# Patient Record
Sex: Female | Born: 2000 | Race: White | Hispanic: No | Smoking: Never smoker
Health system: Southern US, Community
[De-identification: ages and names within clinical notes are randomized; demographics above are authoritative.]

## PROBLEM LIST (undated history)

## (undated) DIAGNOSIS — Z789 Other specified health status: Secondary | ICD-10-CM

## (undated) HISTORY — PX: NO PAST SURGERIES: SHX2092

## (undated) HISTORY — DX: Other specified health status: Z78.9

---
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
## 2022-03-17 ENCOUNTER — Telehealth: Payer: Self-pay | Admitting: Emergency Medicine

## 2022-03-17 NOTE — Telephone Encounter (Signed)
Patient states that she has been having painful periods. She reports that the bleeding is not that heavy but the pain associated has been more intense. Advised patient to make an appt. AP  ?

## 2022-03-21 ENCOUNTER — Ambulatory Visit: Payer: Medicaid Other | Admitting: Family Medicine

## 2022-03-24 ENCOUNTER — Other Ambulatory Visit (HOSPITAL_COMMUNITY)
Admission: RE | Admit: 2022-03-24 | Discharge: 2022-03-24 | Disposition: A | Discharge status: Home / Self Care | Payer: Medicaid Other | Source: Ambulatory Visit | Attending: Family Medicine | Admitting: Family Medicine

## 2022-03-24 ENCOUNTER — Ambulatory Visit (INDEPENDENT_AMBULATORY_CARE_PROVIDER_SITE_OTHER): Payer: Medicaid Other | Admitting: Family Medicine

## 2022-03-24 ENCOUNTER — Encounter: Payer: Self-pay | Admitting: Family Medicine

## 2022-03-24 VITALS — BP 98/65 | HR 58 | Temp 98.1°F | Ht 60.0 in | Wt 100.0 lb

## 2022-03-24 DIAGNOSIS — B379 Candidiasis, unspecified: Secondary | ICD-10-CM

## 2022-03-24 DIAGNOSIS — N946 Dysmenorrhea, unspecified: Secondary | ICD-10-CM | POA: Diagnosis not present

## 2022-03-24 LAB — URINALYSIS
Bilirubin, UA: NEGATIVE
Glucose, UA: NEGATIVE
Ketones, UA: NEGATIVE
Nitrite, UA: NEGATIVE
Protein,UA: NEGATIVE
Specific Gravity, UA: 1.02 (ref 1.005–1.030)
Urobilinogen, Ur: 0.2 mg/dL (ref 0.2–1.0)
pH, UA: 6.5 (ref 5.0–7.5)

## 2022-03-24 LAB — PREGNANCY, URINE: Preg Test, Ur: NEGATIVE

## 2022-03-24 MED ORDER — NORGESTIM-ETH ESTRAD TRIPHASIC 0.18/0.215/0.25 MG-25 MCG PO TABS: 1.0000 | ORAL_TABLET | Freq: Every day | ORAL | 11 refills | Status: DC

## 2022-03-24 MED ORDER — NAPROXEN 500 MG PO TABS: 500.0000 mg | ORAL_TABLET | Freq: Two times a day (BID) | ORAL | 0 refills | Status: DC

## 2022-03-24 NOTE — Progress Notes (Signed)
?  ? ?Subjective:  ?Patient ID: Jennifer Alexander, female    DOB: June 30, 2001, 21 y.o.   MRN: [id number] ? ?Patient Care Team: ?Claretta Fraise, MD as PCP - General (Family Medicine)  ? ?Chief Complaint:  Dysmenorrhea ? ? ?HPI: ?Jennifer Alexander is a 21 y.o. female presenting on 03/24/2022 for Dysmenorrhea ? ? ?Pt presents today with complaints of painful menstrual cramps. She was on OCPs when she was 16 and denies painful menses at this time. States she stopped the OCPs several years ago due to acne and weight gain. She is sexually active but denies any vaginal discharge or abnormal bleeding. No dyspareunia. Last unprotected intercourse over 4 weeks ago. LMP now. She does report regular cycles, usually heavy for first 3 days then light for the last 2. Reports significant cramping, worse at onset and eases off by end of cycle. She has been taking Tylenol and Midol with minimal relief of symptoms.  ?  ? ? ?Relevant past medical, surgical, family, and social history reviewed and updated as indicated.  ?Allergies and medications reviewed and updated. Data reviewed: Chart in Epic. ? ? ?History reviewed. No pertinent past medical history. ? ?History reviewed. No pertinent surgical history. ? ?Social History  ? ?Socioeconomic History  ? Marital status: Single  ?  Spouse name: Not on file  ? Number of children: Not on file  ? Years of education: Not on file  ? Highest education level: Not on file  ?Occupational History  ? Not on file  ?Tobacco Use  ? Smoking status: Never  ?  Passive exposure: Yes  ? Smokeless tobacco: Never  ?Substance and Sexual Activity  ? Alcohol use: No  ?  Alcohol/week: 0.0 standard drinks  ? Drug use: No  ? Sexual activity: Not on file  ?Other Topics Concern  ? Not on file  ?Social History Narrative  ? Not on file  ? ?Social Determinants of Health  ? ?Financial Resource Strain: Not on file  ?Food Insecurity: Not on file  ?Transportation Needs: Not on file  ?Physical Activity: Not on file  ?Stress: Not on  file  ?Social Connections: Not on file  ?Intimate Partner Violence: Not on file  ? ? ?Outpatient Encounter Medications as of 03/24/2022  ?Medication Sig  ? naproxen (NAPROSYN) 500 MG tablet Take 1 tablet (500 mg total) by mouth 2 (two) times daily with a meal. Start 2 days before cycle and continue until end of cycle  ? Norgestimate-Ethinyl Estradiol Triphasic (ORTHO TRI-CYCLEN LO) 0.18/0.215/0.25 MG-25 MCG tab Take 1 tablet by mouth daily.  ? [DISCONTINUED] amoxicillin-clavulanate (AUGMENTIN) 875-125 MG tablet Take 1 tablet by mouth 2 (two) times daily. Take all of this medication  ? [DISCONTINUED] SPRINTEC 28 0.25-35 MG-MCG tablet TAKE 1 TABLET BY MOUTH EVERY DAY  ? ?No facility-administered encounter medications on file as of 03/24/2022.  ? ? ?No Known Allergies ? ?Review of Systems  ?Constitutional:  Negative for activity change, appetite change, chills, fatigue and fever.  ?HENT: Negative.    ?Eyes: Negative.   ?Respiratory:  Negative for cough, chest tightness and shortness of breath.   ?Cardiovascular:  Negative for chest pain, palpitations and leg swelling.  ?Gastrointestinal:  Positive for abdominal pain. Negative for abdominal distention, anal bleeding, blood in stool, constipation, diarrhea, nausea, rectal pain and vomiting.  ?Endocrine: Negative.   ?Genitourinary:  Positive for menstrual problem. Negative for decreased urine volume, difficulty urinating, dyspareunia, dysuria, enuresis, flank pain, frequency, genital sores, hematuria, pelvic pain, urgency, vaginal bleeding, vaginal discharge and  vaginal pain.  ?Musculoskeletal:  Negative for arthralgias and myalgias.  ?Skin: Negative.   ?Allergic/Immunologic: Negative.   ?Neurological:  Negative for dizziness, weakness and headaches.  ?Hematological: Negative.   ?Psychiatric/Behavioral:  Negative for confusion, hallucinations, sleep disturbance and suicidal ideas.   ?All other systems reviewed and are negative. ? ?   ? ?Objective:  ?BP 98/65   Pulse (!) 58    Temp 98.1 ?F (36.7 ?C)   Ht 5' (1.524 m)   Wt 100 lb (45.4 kg)   LMP 03/15/2022 (Approximate)   SpO2 99%   BMI 19.53 kg/m?   ? ?Wt Readings from Last 3 Encounters:  ?03/24/22 100 lb (45.4 kg) (4 %, Z= -1.71)*  ?07/17/19 118 lb 6.4 oz (53.7 kg) (50 %, Z= 0.01)*  ?01/15/19 117 lb (53.1 kg) (52 %, Z= 0.04)*  ? ?* Growth percentiles are based on CDC (Girls, 2-20 Years) data.  ? ? ?Physical Exam ?Vitals and nursing note reviewed.  ?Constitutional:   ?   General: She is not in acute distress. ?   Appearance: Normal appearance. She is well-developed, well-groomed and normal weight. She is not ill-appearing, toxic-appearing or diaphoretic.  ?HENT:  ?   Head: Normocephalic and atraumatic.  ?   Jaw: There is normal jaw occlusion.  ?   Right Ear: Hearing normal.  ?   Left Ear: Hearing normal.  ?   Nose: Nose normal.  ?   Mouth/Throat:  ?   Lips: Pink.  ?   Mouth: Mucous membranes are moist.  ?   Pharynx: Oropharynx is clear. Uvula midline.  ?Eyes:  ?   General: Lids are normal.  ?   Extraocular Movements: Extraocular movements intact.  ?   Conjunctiva/sclera: Conjunctivae normal.  ?   Pupils: Pupils are equal, round, and reactive to light.  ?Neck:  ?   Thyroid: No thyroid mass, thyromegaly or thyroid tenderness.  ?   Vascular: No carotid bruit or JVD.  ?   Trachea: Trachea and phonation normal.  ?Cardiovascular:  ?   Rate and Rhythm: Normal rate and regular rhythm.  ?   Chest Wall: PMI is not displaced.  ?   Heart sounds: Normal heart sounds.  ?Pulmonary:  ?   Effort: Pulmonary effort is normal.  ?   Breath sounds: Normal breath sounds.  ?Abdominal:  ?   General: Bowel sounds are normal. There is no distension or abdominal bruit.  ?   Palpations: Abdomen is soft. There is no hepatomegaly, splenomegaly or mass.  ?   Tenderness: There is no abdominal tenderness. There is no right CVA tenderness, left CVA tenderness, guarding or rebound.  ?   Hernia: No hernia is present.  ?Musculoskeletal:  ?   Cervical back: Normal  range of motion and neck supple.  ?   Right lower leg: No edema.  ?   Left lower leg: No edema.  ?Lymphadenopathy:  ?   Cervical: No cervical adenopathy.  ?Skin: ?   General: Skin is warm and dry.  ?   Capillary Refill: Capillary refill takes less than 2 seconds.  ?   Coloration: Skin is not cyanotic.  ?Neurological:  ?   General: No focal deficit present.  ?   Mental Status: She is alert and oriented to person, place, and time.  ?   Sensory: Sensation is intact.  ?   Motor: Motor function is intact.  ?   Coordination: Coordination is intact.  ?   Gait: Gait is intact.  ?  Deep Tendon Reflexes: Reflexes are normal and symmetric.  ?Psychiatric:     ?   Attention and Perception: Attention and perception normal.     ?   Mood and Affect: Mood and affect normal.     ?   Speech: Speech normal.     ?   Behavior: Behavior normal. Behavior is cooperative.     ?   Thought Content: Thought content normal.     ?   Cognition and Memory: Cognition and memory normal.     ?   Judgment: Judgment normal.  ? ? ?Results for orders placed or performed in visit on 01/15/19  ?Rapid Strep Screen ([facility] ONLY)  ? Specimen: Other  ? OTHER  ?Result Value Ref Range  ? Strep Gp A Ag, IA W/Reflex Negative Negative  ?Culture, Group A Strep  ? OTHER  ?Result Value Ref Range  ? Strep A Culture CANCELED   ? ?   ? ?Pertinent labs & imaging results that were available during my care of the patient were reviewed by me and considered in my medical decision making. ? ?Assessment & Plan:  ?Neelie was seen today for dysmenorrhea. ? ?Diagnoses and all orders for this visit: ? ?Dysmenorrhea ?Pregnancy test negative. Urine dip unremarkable. STI testing pending. Will start OCPs as prescribed and Naproxen twice daily during cycles. If this is not beneficial, will obtain labs and [place]. Pt to follow up in 3 months.  ?-     Pregnancy, urine ?-     Urinalysis ?-     Urine cytology ancillary only ?-     Norgestimate-Ethinyl Estradiol Triphasic (ORTHO  TRI-CYCLEN LO) 0.18/0.215/0.25 MG-25 MCG tab; Take 1 tablet by mouth daily. ?-     naproxen (NAPROSYN) 500 MG tablet; Take 1 tablet (500 mg total) by mouth 2 (two) times daily with a meal. Start 2 days before cycle and

## 2022-03-27 LAB — URINE CYTOLOGY ANCILLARY ONLY
Candida Urine: POSITIVE — AB
Chlamydia: NEGATIVE
Comment: NEGATIVE
Comment: NEGATIVE
Comment: NORMAL
Neisseria Gonorrhea: NEGATIVE
Trichomonas: NEGATIVE

## 2022-03-28 MED ORDER — FLUCONAZOLE 150 MG PO TABS: ORAL_TABLET | ORAL | 0 refills | Status: DC

## 2022-03-28 NOTE — Addendum Note (Signed)
Addended by: Baruch Gouty on: 03/28/2022 11:41 AM ? ? Modules accepted: Orders ? ?

## 2022-06-01 DIAGNOSIS — N10 Acute tubulo-interstitial nephritis: Secondary | ICD-10-CM | POA: Diagnosis not present

## 2022-06-01 DIAGNOSIS — R112 Nausea with vomiting, unspecified: Secondary | ICD-10-CM | POA: Diagnosis not present

## 2022-08-01 DIAGNOSIS — N760 Acute vaginitis: Secondary | ICD-10-CM | POA: Diagnosis not present

## 2022-08-01 DIAGNOSIS — O23591 Infection of other part of genital tract in pregnancy, first trimester: Secondary | ICD-10-CM | POA: Diagnosis not present

## 2022-08-01 DIAGNOSIS — Z3A01 Less than 8 weeks gestation of pregnancy: Secondary | ICD-10-CM | POA: Diagnosis not present

## 2022-08-01 DIAGNOSIS — O209 Hemorrhage in early pregnancy, unspecified: Secondary | ICD-10-CM | POA: Diagnosis not present

## 2022-08-01 DIAGNOSIS — B9689 Other specified bacterial agents as the cause of diseases classified elsewhere: Secondary | ICD-10-CM | POA: Diagnosis not present

## 2022-08-01 LAB — OB RESULTS CONSOLE ABO/RH: RH Type: POSITIVE

## 2022-08-01 LAB — OB RESULTS CONSOLE GC/CHLAMYDIA
Chlamydia: NEGATIVE
Neisseria Gonorrhea: NEGATIVE

## 2022-08-01 LAB — OB RESULTS CONSOLE RUBELLA ANTIBODY, IGM: Rubella: IMMUNE

## 2022-08-01 LAB — OB RESULTS CONSOLE HIV ANTIBODY (ROUTINE TESTING): HIV: NONREACTIVE

## 2022-08-01 LAB — OB RESULTS CONSOLE HEPATITIS B SURFACE ANTIGEN: Hepatitis B Surface Ag: NEGATIVE

## 2022-08-14 DIAGNOSIS — Z3201 Encounter for pregnancy test, result positive: Secondary | ICD-10-CM | POA: Diagnosis not present

## 2022-08-14 DIAGNOSIS — N912 Amenorrhea, unspecified: Secondary | ICD-10-CM | POA: Diagnosis not present

## 2022-08-14 DIAGNOSIS — O219 Vomiting of pregnancy, unspecified: Secondary | ICD-10-CM | POA: Diagnosis not present

## 2022-08-14 DIAGNOSIS — Z3689 Encounter for other specified antenatal screening: Secondary | ICD-10-CM | POA: Diagnosis not present

## 2022-08-14 LAB — SICKLE CELL SCREEN: Sickle Cell Screen: NEGATIVE

## 2022-08-14 LAB — HEPATITIS C ANTIBODY: HCV Ab: NEGATIVE

## 2022-08-14 LAB — OB RESULTS CONSOLE ANTIBODY SCREEN: Antibody Screen: NEGATIVE

## 2022-08-14 LAB — OB RESULTS CONSOLE RPR: RPR: NONREACTIVE

## 2022-09-11 DIAGNOSIS — Z23 Encounter for immunization: Secondary | ICD-10-CM | POA: Diagnosis not present

## 2022-09-11 DIAGNOSIS — Z31430 Encounter of female for testing for genetic disease carrier status for procreative management: Secondary | ICD-10-CM | POA: Diagnosis not present

## 2022-09-11 DIAGNOSIS — Z3490 Encounter for supervision of normal pregnancy, unspecified, unspecified trimester: Secondary | ICD-10-CM | POA: Diagnosis not present

## 2022-09-11 DIAGNOSIS — Z3689 Encounter for other specified antenatal screening: Secondary | ICD-10-CM | POA: Diagnosis not present

## 2022-10-09 DIAGNOSIS — R42 Dizziness and giddiness: Secondary | ICD-10-CM | POA: Diagnosis not present

## 2022-10-09 DIAGNOSIS — O99280 Endocrine, nutritional and metabolic diseases complicating pregnancy, unspecified trimester: Secondary | ICD-10-CM | POA: Diagnosis not present

## 2022-10-09 DIAGNOSIS — Z3689 Encounter for other specified antenatal screening: Secondary | ICD-10-CM | POA: Diagnosis not present

## 2022-10-09 DIAGNOSIS — O99019 Anemia complicating pregnancy, unspecified trimester: Secondary | ICD-10-CM | POA: Diagnosis not present

## 2022-10-30 DIAGNOSIS — Z363 Encounter for antenatal screening for malformations: Secondary | ICD-10-CM | POA: Diagnosis not present

## 2022-11-28 DIAGNOSIS — Z3689 Encounter for other specified antenatal screening: Secondary | ICD-10-CM | POA: Diagnosis not present

## 2022-11-28 DIAGNOSIS — Z3401 Encounter for supervision of normal first pregnancy, first trimester: Secondary | ICD-10-CM | POA: Diagnosis not present

## 2022-12-04 NOTE — L&D Delivery Note (Signed)
[facility] Delivery Note  Zuri Saccomanno is a 22 y.o. G1P0 s/p NSVD at [redacted]w[redacted]d. She was admitted for IOL due to NRFHRT.   ROM: 8h 76m with meconium, thick fluid GBS Status: positive Maximum Maternal Temperature: 98.3  Labor Progress: Admitted for IOL due to NRFHRT in MAU but immediately improved on L&D Had FHR associated with lowered BP from epidural but was resuscitated  Delivery Date/Time: 03/17/23 0758 Delivery: Called to room and patient was complete and pushing. Head delivered OA. No nuchal cord present. There was compound posterior shoulder which was the right shoulder I did not perform any head traction and did a posteriro arm delivery. Shoulder and body delivered in usual fashion.  Infant with spontaneous cry, placed on mother's abdomen, handed to NICU team who I requested to be present due to the thick meconium stained fluid. Cord clamped x 2 and cut within 30 sec Cord blood drawn.  Placenta delivered spontaneously with gentle cord traction.  Fundus firm with massage and Pitocin.  Labia, perineum, vagina, and cervix inspected inspected and there   Placenta: intact 0806 Complications: none Lacerations: none EBL: 102    Infant: female  APGARs 8/9  pending  [facility]  Lazaro Arms, MD 03/17/2023 9:00 AM

## 2022-12-24 DIAGNOSIS — Z3A28 28 weeks gestation of pregnancy: Secondary | ICD-10-CM | POA: Diagnosis not present

## 2022-12-24 DIAGNOSIS — N930 Postcoital and contact bleeding: Secondary | ICD-10-CM | POA: Diagnosis not present

## 2022-12-24 DIAGNOSIS — O99891 Other specified diseases and conditions complicating pregnancy: Secondary | ICD-10-CM | POA: Diagnosis not present

## 2022-12-28 ENCOUNTER — Encounter: Payer: Self-pay | Admitting: Advanced Practice Midwife

## 2022-12-28 ENCOUNTER — Encounter: Payer: Medicaid Other | Admitting: *Deleted

## 2022-12-28 ENCOUNTER — Ambulatory Visit (INDEPENDENT_AMBULATORY_CARE_PROVIDER_SITE_OTHER): Payer: Medicaid Other | Admitting: Advanced Practice Midwife

## 2022-12-28 ENCOUNTER — Encounter: Payer: Self-pay | Admitting: *Deleted

## 2022-12-28 VITALS — BP 99/64 | HR 87 | Wt 107.0 lb

## 2022-12-28 DIAGNOSIS — Z3A28 28 weeks gestation of pregnancy: Secondary | ICD-10-CM

## 2022-12-28 DIAGNOSIS — Z3403 Encounter for supervision of normal first pregnancy, third trimester: Secondary | ICD-10-CM

## 2022-12-28 DIAGNOSIS — Z1332 Encounter for screening for maternal depression: Secondary | ICD-10-CM | POA: Diagnosis not present

## 2022-12-28 DIAGNOSIS — Z3400 Encounter for supervision of normal first pregnancy, unspecified trimester: Secondary | ICD-10-CM | POA: Insufficient documentation

## 2022-12-28 NOTE — Patient Instructions (Addendum)
1. Before your test, do not eat or drink anything for 8-10 hours prior to your  appointment (a small amount of water is allowed and you may take any medicines you normally take). Be sure to drink lots of water the day before. 2. When you arrive, your blood will be drawn for a 'fasting' blood sugar level.  Then you will be given a sweetened carbonated beverage to drink. You should  complete drinking this beverage within five minutes. After finishing the  beverage, you will have your blood drawn exactly 1 and 2 hours later. Having  your blood drawn on time is an important part of this test. A total of three blood  samples will be done. 3. The test takes approximately 2  hours. During the test, do not have anything to  eat or drink. Do not smoke, chew gum (not even sugarless gum) or use breath mints.  4. During the test you should remain close by and seated as much as possible and  avoid walking around. You may want to bring a book or something else to  occupy your time.  5. After your test, you may eat and drink as normal. You may want to bring a snack  to eat after the test is finished. Your provider will advise you as to the results of  this test and any follow-up if necessary  If your sugar test is positive for gestational diabetes, you will be given an phone call and further instructions discussed. If you wish to know all of your test results before your next appointment, feel free to call the office, or look up your test results on Mychart.  (The range that the lab uses for normal values of the sugar test are not necessarily the range that is used for pregnant women; if your results are within the normal range, they are definitely normal.  However, if a value is deemed "high" by the lab, it may not be too high for a pregnant woman.  We will need to discuss the results if your value(s) fall in the "high" category).     Tdap Vaccine It is recommended that you get the Tdap vaccine during the  third trimester of EACH pregnancy to help protect your baby from getting pertussis (whooping cough) 27-36 weeks is the BEST time to do this so that you can pass the protection on to your baby. During pregnancy is better than after pregnancy, but if you are unable to get it during pregnancy it will be offered at the hospital. You will be offered this vaccine in the office after 27 weeks.  If you do not have health insurance, you can get the vaccine from the [facility] Department (no appointment needed).  Everyone who will be around your baby should also be up-to-date on their vaccines. Adults (who are not pregnant) only need 1 dose of Tdap during adulthood.        Charolotte Eke, I greatly value your feedback.  If you receive a survey following your visit with [place] today, we appreciate you taking the time to fill it out.  Thanks, Nigel Berthold, DNP, CNM  [place]!!! It is now [place] at [facility] ([place], [place] [address]) Entrance located off of [address] parking   Go to [email or web address] to register for FREE online childbirth classes    Call the office [phone]) or go to [place] if:  You begin to have strong, frequent contractions Your water breaks.  Sometimes it is a big gush of fluid, sometimes it is just a trickle that keeps getting your panties wet or running down your legs You have vaginal bleeding.  It is normal to have a small amount of spotting if your cervix was checked.  You don't feel your baby moving like normal.  If you don't, get you something to eat and drink and lay down and focus on feeling your baby move.  You should feel at least 10 movements in 2 hours.  If you don't, you should call the office or go to [place] Blood Pressure Monitoring for Patients   Your provider has recommended that you check your blood pressure (BP) at least once a  week at home. If you do not have a blood pressure cuff at home, one will be provided for you. Contact your provider if you have not received your monitor within 1 week.   Helpful Tips for Accurate Home Blood Pressure Checks  Don't smoke, exercise, or drink caffeine 30 minutes before checking your BP Use the restroom before checking your BP (a full bladder can raise your pressure) Relax in a comfortable upright chair Feet on the ground Left arm resting comfortably on a flat surface at the level of your heart Legs uncrossed Back supported Sit quietly and don't talk Place the cuff on your bare arm Adjust snuggly, so that only two fingertips can fit between your skin and the top of the cuff Check 2 readings separated by at least one minute Keep a log of your BP readings For a visual, please reference this diagram: http://ccnc.care/bpdiagram  Provider Name: [facility]     Phone: [phone]  Zone 1: ALL CLEAR  Continue to monitor your symptoms:  BP reading is less than 140 (top number) or less than 90 (bottom number)  No right upper stomach pain No headaches or seeing spots No feeling nauseated or throwing up No swelling in face and hands  Zone 2: CAUTION Call your doctor's office for any of the following:  BP reading is greater than 140 (top number) or greater than 90 (bottom number)  Stomach pain under your ribs in the middle or right side Headaches or seeing spots Feeling nauseated or throwing up Swelling in face and hands  Zone 3: EMERGENCY  Seek immediate medical care if you have any of the following:  BP reading is greater than160 (top number) or greater than 110 (bottom number) Severe headaches not improving with Tylenol Serious difficulty catching your breath Any worsening symptoms from Zone 2

## 2022-12-28 NOTE — Progress Notes (Signed)
INITIAL OBSTETRICAL VISIT Patient name: Jennifer Alexander MRN [id number]  Date of birth: 05-09-2001 Chief Complaint:   Initial Prenatal Visit (Tx care 28wk from [facility])  History of Present Illness:   Jennifer Alexander is a 22 y.o. G19P0 Caucasian female at [redacted]w[redacted]d by [place] at 73 weeks with an Estimated Date of Delivery: 03/16/23 being seen today for her initial obstetrical visit.   Her obstetrical history is significant for first baby.  PNC at [facility], records here, all up to date except for PN2. Today she reports no complaints.     12/28/2022   11:33 AM 03/24/2022   12:36 PM 07/17/2019   11:11 AM 01/15/2019    5:32 PM 08/14/2018    3:47 PM  Depression screen PHQ 2/9  Decreased Interest 0 0 0 0 0  Down, Depressed, Hopeless 0 0 0 0 0  PHQ - 2 Score 0 0 0 0 0  Altered sleeping 0 1 0  0  Tired, decreased energy 0 1 0  0  Change in appetite 0 1 0  0  Feeling bad or failure about yourself  0 0 0  0  Trouble concentrating 0 0 0  0  Moving slowly or fidgety/restless 0 0 0  0  Suicidal thoughts 0 0 0  0  PHQ-9 Score 0 3 0  0  Difficult doing work/chores  Somewhat difficult       Patient's last menstrual period was 06/09/2022.  Review of Systems:   Pertinent items are noted in HPI Denies cramping/contractions, leakage of fluid, vaginal bleeding, abnormal vaginal discharge w/ itching/odor/irritation, headaches, visual changes, shortness of breath, chest pain, abdominal pain, severe nausea/vomiting, or problems with urination or bowel movements unless otherwise stated above.  Pertinent History Reviewed:  Reviewed past medical,surgical, social, obstetrical and family history.  Reviewed problem list, medications and allergies. OB History  Gravida Para Term Preterm AB Living  1            SAB IAB Ectopic Multiple Live Births               # Outcome Date GA Lbr Len/2nd Weight Sex Delivery Anes PTL Lv  1 Current            Physical Assessment:   Vitals:   12/28/22 1131  BP: 99/64  Pulse:  87  Weight: 107 lb (48.5 kg)  Body mass index is 20.9 kg/m.       Physical Examination:  General appearance - well appearing, and in no distress  Mental status - alert, oriented to person, place, and time  Psych:  She has a normal mood and affect  Skin - warm and dry, normal color, no suspicious lesions noted  Chest - effort normal  Heart - normal rate and regular rhythm  Abdomen - soft, nontender  Extremities:  No swelling or varicosities noted    No results found for this or any previous visit (from the past 24 hour(s)).   Indications for ASA therapy (per uptodate)    12/28/2022   11:33 AM 03/24/2022   12:36 PM 07/17/2019   11:11 AM 01/15/2019    5:32 PM 08/14/2018    3:47 PM  Depression screen PHQ 2/9  Decreased Interest 0 0 0 0 0  Down, Depressed, Hopeless 0 0 0 0 0  PHQ - 2 Score 0 0 0 0 0  Altered sleeping 0 1 0  0  Tired, decreased energy 0 1 0  0  Change in appetite 0 1  0  0  Feeling bad or failure about yourself  0 0 0  0  Trouble concentrating 0 0 0  0  Moving slowly or fidgety/restless 0 0 0  0  Suicidal thoughts 0 0 0  0  PHQ-9 Score 0 3 0  0  Difficult doing work/chores  Somewhat difficult           12/28/2022   11:33 AM 03/24/2022   12:37 PM  GAD 7 : Generalized Anxiety Score  Nervous, Anxious, on Edge 1 3  Control/stop worrying 0 2  Worry too much - different things 0 0  Trouble relaxing 0 0  Restless 0 0  Easily annoyed or irritable 0 1  Afraid - awful might happen 0 1  Total GAD 7 Score 1 7  Anxiety Difficulty  Somewhat difficult      Assessment & Plan:  1) Low-Risk Pregnancy G1P0 at [redacted]w[redacted]d with an Estimated Date of Delivery: 03/16/23   2) Initial OB visit, transfer from [place]: No orders of the defined types were placed in this encounter.    Continue prenatal vitamins Reviewed n/v relief measures and warning s/s to report Reviewed recommended weight gain based on pre-gravid BMI CCNC completed> form faxed if has or is planning to  apply for medicaid The nature of [place] for [facility] with multiple MDs and other Advanced Practice Providers was explained to patient; also emphasized that fellows, residents, and students are part of our team. Has home bp cuff. Check bp weekly, let us know if >140/90.        Joaquim Lai Cresenzo-Dishmon 12:23 PM

## 2022-12-29 ENCOUNTER — Other Ambulatory Visit: Payer: Medicaid Other

## 2022-12-29 DIAGNOSIS — Z3403 Encounter for supervision of normal first pregnancy, third trimester: Secondary | ICD-10-CM | POA: Diagnosis not present

## 2022-12-29 DIAGNOSIS — Z3A29 29 weeks gestation of pregnancy: Secondary | ICD-10-CM | POA: Diagnosis not present

## 2022-12-29 DIAGNOSIS — Z131 Encounter for screening for diabetes mellitus: Secondary | ICD-10-CM | POA: Diagnosis not present

## 2022-12-30 LAB — RPR: RPR Ser Ql: NONREACTIVE

## 2022-12-30 LAB — CBC
Hematocrit: 30.5 % — ABNORMAL LOW (ref 34.0–46.6)
Hemoglobin: 9.9 g/dL — ABNORMAL LOW (ref 11.1–15.9)
MCH: 31.3 pg (ref 26.6–33.0)
MCHC: 32.5 g/dL (ref 31.5–35.7)
MCV: 97 fL (ref 79–97)
Platelets: 322 10*3/uL (ref 150–450)
RBC: 3.16 x10E6/uL — ABNORMAL LOW (ref 3.77–5.28)
RDW: 11.9 % (ref 11.7–15.4)
WBC: 8.4 10*3/uL (ref 3.4–10.8)

## 2022-12-30 LAB — ANTIBODY SCREEN: Antibody Screen: NEGATIVE

## 2022-12-30 LAB — HIV ANTIBODY (ROUTINE TESTING W REFLEX): HIV Screen 4th Generation wRfx: NONREACTIVE

## 2022-12-30 LAB — GLUCOSE TOLERANCE, 2 HOURS W/ 1HR
Glucose, 1 hour: 128 mg/dL (ref 70–179)
Glucose, 2 hour: 93 mg/dL (ref 70–152)
Glucose, Fasting: 73 mg/dL (ref 70–91)

## 2023-01-18 ENCOUNTER — Encounter: Payer: Self-pay | Admitting: Obstetrics and Gynecology

## 2023-01-18 ENCOUNTER — Ambulatory Visit (INDEPENDENT_AMBULATORY_CARE_PROVIDER_SITE_OTHER): Payer: Medicaid Other | Admitting: Obstetrics and Gynecology

## 2023-01-18 VITALS — BP 93/57 | HR 75 | Wt 111.0 lb

## 2023-01-18 DIAGNOSIS — Z23 Encounter for immunization: Secondary | ICD-10-CM | POA: Diagnosis not present

## 2023-01-18 DIAGNOSIS — Z3403 Encounter for supervision of normal first pregnancy, third trimester: Secondary | ICD-10-CM

## 2023-01-18 DIAGNOSIS — Z3A31 31 weeks gestation of pregnancy: Secondary | ICD-10-CM

## 2023-01-18 NOTE — Progress Notes (Signed)
Subjective:  Jennifer Alexander is a 22 y.o. G1P0 at 22w6dbeing seen today for ongoing prenatal care.  She is currently monitored for the following issues for this low-risk pregnancy and has Gastroesophageal reflux disease with esophagitis; Dysmenorrhea; and Supervision of normal first pregnancy on their problem list.  Patient reports no complaints.  Contractions: Not present. Vag. Bleeding: None.  Movement: Present. Denies leaking of fluid.   The following portions of the patient's history were reviewed and updated as appropriate: allergies, current medications, past family history, past medical history, past social history, past surgical history and problem list. Problem list updated.  Objective:   Vitals:   01/18/23 1107  BP: (!) 93/57  Pulse: 75  Weight: 111 lb (50.3 kg)    Fetal Status:     Movement: Present     General:  Alert, oriented and cooperative. Patient is in no acute distress.  Skin: Skin is warm and dry. No rash noted.   Cardiovascular: Normal heart rate noted  Respiratory: Normal respiratory effort, no problems with respiration noted  Abdomen: Soft, gravid, appropriate for gestational age. Pain/Pressure: Absent     Pelvic:  Cervical exam deferred        Extremities: Normal range of motion.     Mental Status: Normal mood and affect. Normal behavior. Normal judgment and thought content.   Urinalysis:      Assessment and Plan:  Pregnancy: G1P0 at 353w6d1. Encounter for supervision of normal first pregnancy in third trimester Stable Tdap vaccine today  Preterm labor symptoms and general obstetric precautions including but not limited to vaginal bleeding, contractions, leaking of fluid and fetal movement were reviewed in detail with the patient. Please refer to After Visit Summary for other counseling recommendations.  Return in about 2 weeks (around 02/01/2023) for OB visit, face to face, any provider.   ErChancy MilroyMD

## 2023-01-18 NOTE — Addendum Note (Signed)
Addended by: Janece Canterbury on: 01/18/2023 11:44 AM   Modules accepted: Orders

## 2023-02-01 ENCOUNTER — Encounter: Payer: Self-pay | Admitting: Obstetrics and Gynecology

## 2023-02-01 ENCOUNTER — Ambulatory Visit (INDEPENDENT_AMBULATORY_CARE_PROVIDER_SITE_OTHER): Payer: Medicaid Other | Admitting: Obstetrics and Gynecology

## 2023-02-01 VITALS — BP 98/64 | HR 81 | Wt 113.0 lb

## 2023-02-01 DIAGNOSIS — Z3A33 33 weeks gestation of pregnancy: Secondary | ICD-10-CM

## 2023-02-01 DIAGNOSIS — Z3403 Encounter for supervision of normal first pregnancy, third trimester: Secondary | ICD-10-CM

## 2023-02-01 NOTE — Progress Notes (Signed)
Subjective:  Jennifer Alexander is a 22 y.o. G1P0 at 44w6dbeing seen today for ongoing prenatal care.  She is currently monitored for the following issues for this low-risk pregnancy and has Gastroesophageal reflux disease with esophagitis; Dysmenorrhea; and Supervision of normal first pregnancy on their problem list.  Patient reports no complaints.  Contractions: Not present. Vag. Bleeding: None.  Movement: Present. Denies leaking of fluid.   The following portions of the patient's history were reviewed and updated as appropriate: allergies, current medications, past family history, past medical history, past social history, past surgical history and problem list. Problem list updated.  Objective:   Vitals:   02/01/23 1148  BP: 98/64  Pulse: 81  Weight: 113 lb (51.3 kg)    Fetal Status:     Movement: Present     General:  Alert, oriented and cooperative. Patient is in no acute distress.  Skin: Skin is warm and dry. No rash noted.   Cardiovascular: Normal heart rate noted  Respiratory: Normal respiratory effort, no problems with respiration noted  Abdomen: Soft, gravid, appropriate for gestational age. Pain/Pressure: Absent     Pelvic:  Cervical exam deferred        Extremities: Normal range of motion.  Edema: None  Mental Status: Normal mood and affect. Normal behavior. Normal judgment and thought content.   Urinalysis:      Assessment and Plan:  Pregnancy: G1P0 at 319w6d1. Encounter for supervision of normal first pregnancy in third trimester Stable  Preterm labor symptoms and general obstetric precautions including but not limited to vaginal bleeding, contractions, leaking of fluid and fetal movement were reviewed in detail with the patient. Please refer to After Visit Summary for other counseling recommendations.  Return in about 2 weeks (around 02/15/2023) for OB visit, face to face, any provider.   ErChancy MilroyMD

## 2023-02-15 ENCOUNTER — Ambulatory Visit (INDEPENDENT_AMBULATORY_CARE_PROVIDER_SITE_OTHER): Payer: Medicaid Other | Admitting: Advanced Practice Midwife

## 2023-02-15 ENCOUNTER — Encounter: Payer: Self-pay | Admitting: Advanced Practice Midwife

## 2023-02-15 VITALS — BP 102/64 | HR 106 | Wt 115.4 lb

## 2023-02-15 DIAGNOSIS — Z3403 Encounter for supervision of normal first pregnancy, third trimester: Secondary | ICD-10-CM

## 2023-02-15 DIAGNOSIS — Z3A35 35 weeks gestation of pregnancy: Secondary | ICD-10-CM

## 2023-02-15 NOTE — Patient Instructions (Signed)

## 2023-02-15 NOTE — Progress Notes (Signed)
   LOW-RISK PREGNANCY VISIT Patient name: Jennifer Alexander MRN [id number]  Date of birth: Jun 19, 2001 Chief Complaint:   Routine Prenatal Visit  History of Present Illness:   Jennifer Alexander is a 22 y.o. G1P0 female at [redacted]w[redacted]d with an Estimated Date of Delivery: 03/16/23 being seen today for ongoing management of a low-risk pregnancy.  Today she reports no complaints. Contractions: Not present.  .  Movement: Present. denies leaking of fluid. Review of Systems:   Pertinent items are noted in HPI Denies abnormal vaginal discharge w/ itching/odor/irritation, headaches, visual changes, shortness of breath, chest pain, abdominal pain, severe nausea/vomiting, or problems with urination or bowel movements unless otherwise stated above. Pertinent History Reviewed:  Reviewed past medical,surgical, social, obstetrical and family history.  Reviewed problem list, medications and allergies. Physical Assessment:   Vitals:   02/15/23 1502  BP: 102/64  Pulse: (!) 106  Weight: 115 lb 6.4 oz (52.3 kg)  Body mass index is 22.54 kg/m.        Physical Examination:   General appearance: Well appearing, and in no distress  Mental status: Alert, oriented to person, place, and time  Skin: Warm & dry  Cardiovascular: Normal heart rate noted  Respiratory: Normal respiratory effort, no distress  Abdomen: Soft, gravid, nontender  Pelvic: Cervical exam deferred         Extremities: Edema: None  Fetal Status: Fetal Heart Rate (bpm): 128 Fundal Height: 34 cm Movement: Present    Chaperone:  N/A    No results found for this or any previous visit (from the past 24 hour(s)).  Assessment & Plan:    Pregnancy: G1P0 at [redacted]w[redacted]d 1. Encounter for supervision of normal first pregnancy in third trimester      Meds: No orders of the defined types were placed in this encounter.  Labs/procedures today: none  Plan:  Continue routine obstetrical care  Next visit: prefers in person    Reviewed: Preterm labor symptoms  and general obstetric precautions including but not limited to vaginal bleeding, contractions, leaking of fluid and fetal movement were reviewed in detail with the patient.  All questions were answered. Has home bp cuff. Check bp weekly, let us know if >140/90.   Follow-up: Return in about 1 week (around 02/22/2023) for weekly LROB X4.  Future Appointments  Date Time Provider [place]  02/22/2023 10:10 AM Christin Fudge, CNM [facility]  03/01/2023 11:30 AM Christin Fudge, CNM [facility]  03/08/2023 10:50 AM Christin Fudge, CNM [facility]  03/15/2023 10:50 AM Cresenzo-Dishmon, Joaquim Lai, CNM [facility]    No orders of the defined types were placed in this encounter.  Christin Fudge DNP, CNM 02/15/2023 3:18 PM

## 2023-02-22 ENCOUNTER — Other Ambulatory Visit (HOSPITAL_COMMUNITY)
Admission: RE | Admit: 2023-02-22 | Discharge: 2023-02-22 | Disposition: A | Discharge status: Home / Self Care | Payer: Medicaid Other | Source: Ambulatory Visit | Attending: Advanced Practice Midwife | Admitting: Advanced Practice Midwife

## 2023-02-22 ENCOUNTER — Ambulatory Visit (INDEPENDENT_AMBULATORY_CARE_PROVIDER_SITE_OTHER): Payer: Medicaid Other | Admitting: Advanced Practice Midwife

## 2023-02-22 ENCOUNTER — Encounter: Payer: Self-pay | Admitting: Advanced Practice Midwife

## 2023-02-22 VITALS — BP 93/61 | HR 91 | Wt 118.0 lb

## 2023-02-22 DIAGNOSIS — Z3A36 36 weeks gestation of pregnancy: Secondary | ICD-10-CM | POA: Insufficient documentation

## 2023-02-22 DIAGNOSIS — Z3483 Encounter for supervision of other normal pregnancy, third trimester: Secondary | ICD-10-CM | POA: Diagnosis not present

## 2023-02-22 DIAGNOSIS — Z3493 Encounter for supervision of normal pregnancy, unspecified, third trimester: Secondary | ICD-10-CM | POA: Diagnosis not present

## 2023-02-22 DIAGNOSIS — Z3403 Encounter for supervision of normal first pregnancy, third trimester: Secondary | ICD-10-CM

## 2023-02-22 NOTE — Patient Instructions (Addendum)
  Cervical Ripening (to get your cervix ready for labor) : May try one or all:  Red Raspberry Leaf capsules:  two 300mg  or 400mg  tablets with each meal, 2-3 times a day  Potential Side Effects Of Raspberry Leaf:  Most women do not experience any side effects from drinking raspberry leaf tea. However, nausea and loose stools are possible     Evening Primrose Oil capsules: may take 1 to 3 capsules daily. May also prick one to release the oil and insert it into your vagina at night.  Some of the potential side effects:  Upset stomach  Loose stools or diarrhea  Headaches  Nausea   6 Dates a day (may taste better if warmed in microwave until soft). Found where raisins are in the grocery store  x

## 2023-02-22 NOTE — Progress Notes (Signed)
   LOW-RISK PREGNANCY VISIT Patient name: Jennifer Alexander MRN [id number]  Date of birth: 2001/05/04 Chief Complaint:   Routine Prenatal Visit (culture)  History of Present Illness:   Delbert Dohrman is a 22 y.o. G1P0 female at [redacted]w[redacted]d with an Estimated Date of Delivery: 03/16/23 being seen today for ongoing management of a low-risk pregnancy.  Today she reports seeing white discharge, some chunky.  No itch/irritation. Contractions: Not present.  .  Movement: Present. denies leaking of fluid. Review of Systems:   Pertinent items are noted in HPI Denies abnormal vaginal discharge w/ itching/odor/irritation, headaches, visual changes, shortness of breath, chest pain, abdominal pain, severe nausea/vomiting, or problems with urination or bowel movements unless otherwise stated above. Pertinent History Reviewed:  Reviewed past medical,surgical, social, obstetrical and family history.  Reviewed problem list, medications and allergies. Physical Assessment:   Vitals:   02/22/23 1010  BP: 93/61  Pulse: 91  Weight: 118 lb (53.5 kg)  Body mass index is 23.05 kg/m.        Physical Examination:   General appearance: Well appearing, and in no distress  Mental status: Alert, oriented to person, place, and time  Skin: Warm & dry  Cardiovascular: Normal heart rate noted  Respiratory: Normal respiratory effort, no distress  Abdomen: Soft, gravid, nontender  Pelvic: Cervical exam performed  Dilation: Fingertip Effacement (%): Thick Station: -3  Extremities: Edema: None  Fetal Status: Fetal Heart Rate (bpm): 130 Fundal Height: 36 cm Movement: Present Presentation: Vertex  Chaperone:  Peggy Dones    No results found for this or any previous visit (from the past 24 hour(s)).  Assessment & Plan:    Pregnancy: G1P0 at [redacted]w[redacted]d 1. [redacted] weeks gestation of pregnancy  - Culture, beta strep (group b only) - Cervicovaginal ancillary only( [place])  2. Encounter for supervision of normal first pregnancy in  third trimester  Will not treat/test for yeast d/t asymtomatic    Meds: No orders of the defined types were placed in this encounter.  Labs/procedures today: GBS, GC, CHL  Plan:  Continue routine obstetrical care  Next visit: prefers in person    Reviewed: Term labor symptoms and general obstetric precautions including but not limited to vaginal bleeding, contractions, leaking of fluid and fetal movement were reviewed in detail with the patient.  All questions were answered. Has home bp cuff.. Check bp weekly, let us know if >140/90.   Follow-up: Return for As scheduled.  Future Appointments  Date Time Provider [place]  03/01/2023 11:30 AM Christin Fudge, CNM [facility]  03/08/2023 10:30 AM Christin Fudge, CNM [facility]  03/15/2023 10:50 AM Vevelyn Pat, Joaquim Lai, CNM [facility]    Orders Placed This Encounter  Procedures   Culture, beta strep (group b only)   Christin Fudge DNP, CNM 02/22/2023 10:50 AM

## 2023-02-23 LAB — CERVICOVAGINAL ANCILLARY ONLY
Chlamydia: NEGATIVE
Comment: NEGATIVE
Comment: NORMAL
Neisseria Gonorrhea: NEGATIVE

## 2023-02-25 LAB — CULTURE, BETA STREP (GROUP B ONLY): Strep Gp B Culture: POSITIVE — AB

## 2023-03-01 ENCOUNTER — Ambulatory Visit (INDEPENDENT_AMBULATORY_CARE_PROVIDER_SITE_OTHER): Payer: Medicaid Other | Admitting: Advanced Practice Midwife

## 2023-03-01 ENCOUNTER — Encounter: Payer: Self-pay | Admitting: Advanced Practice Midwife

## 2023-03-01 VITALS — BP 95/61 | HR 83 | Wt 120.0 lb

## 2023-03-01 DIAGNOSIS — Z3A37 37 weeks gestation of pregnancy: Secondary | ICD-10-CM

## 2023-03-01 DIAGNOSIS — Z3403 Encounter for supervision of normal first pregnancy, third trimester: Secondary | ICD-10-CM

## 2023-03-01 NOTE — Patient Instructions (Signed)

## 2023-03-01 NOTE — Progress Notes (Signed)
d  LOW-RISK PREGNANCY VISIT Patient name: Jennifer Alexander MRN [id number]  Date of birth: 2001/06/08 Chief Complaint:   Routine Prenatal Visit (Cervix check)  History of Present Illness:   Jennifer Alexander is a 22 y.o. G1P0 female at [redacted]w[redacted]d with an Estimated Date of Delivery: 03/16/23 being seen today for ongoing management of a low-risk pregnancy.  Today she reports no complaints.  Has been taking RRL tea. Contractions: Not present.  .  Movement: Present. denies leaking of fluid. Review of Systems:   Pertinent items are noted in HPI Denies abnormal vaginal discharge w/ itching/odor/irritation, headaches, visual changes, shortness of breath, chest pain, abdominal pain, severe nausea/vomiting, or problems with urination or bowel movements unless otherwise stated above. Pertinent History Reviewed:  Reviewed past medical,surgical, social, obstetrical and family history.  Reviewed problem list, medications and allergies. Physical Assessment:   Vitals:   03/01/23 1121  BP: 95/61  Pulse: 83  Weight: 120 lb (54.4 kg)  Body mass index is 23.44 kg/m.        Physical Examination:   General appearance: Well appearing, and in no distress  Mental status: Alert, oriented to person, place, and time  Skin: Warm & dry  Cardiovascular: Normal heart rate noted  Respiratory: Normal respiratory effort, no distress  Abdomen: Soft, gravid, nontender  Pelvic: Cervical exam performed  Dilation: 2.5 Effacement (%): 50 Station: -2  Extremities: Edema: None  Fetal Status: Fetal Heart Rate (bpm): 138 Fundal Height: 37 cm Movement: Present Presentation: Vertex  Chaperone:  Peggy Dones    No results found for this or any previous visit (from the past 24 hour(s)).  Assessment & Plan:    Pregnancy: G1P0 at [redacted]w[redacted]d 1. Encounter for supervision of normal first pregnancy in third trimester   2. [redacted] weeks gestation of pregnancy      Meds: No orders of the defined types were placed in this  encounter.  Labs/procedures today: cx check  Plan:  Continue routine obstetrical care  Next visit: prefers in person    Reviewed: Term labor symptoms and general obstetric precautions including but not limited to vaginal bleeding, contractions, leaking of fluid and fetal movement were reviewed in detail with the patient.  All questions were answered. Has home bp cuff. Check bp weekly, let us know if >140/90.   Follow-up: Return for As scheduled.  Future Appointments  Date Time Provider [place]  03/08/2023 10:30 AM Christin Fudge, CNM [facility]  03/15/2023 10:50 AM Cresenzo-Dishmon, Joaquim Lai, CNM [facility]    No orders of the defined types were placed in this encounter.  Christin Fudge DNP, CNM 03/01/2023 11:38 AM

## 2023-03-08 ENCOUNTER — Encounter: Payer: Self-pay | Admitting: Advanced Practice Midwife

## 2023-03-08 ENCOUNTER — Ambulatory Visit (INDEPENDENT_AMBULATORY_CARE_PROVIDER_SITE_OTHER): Payer: Medicaid Other | Admitting: Advanced Practice Midwife

## 2023-03-08 ENCOUNTER — Encounter: Payer: Medicaid Other | Admitting: Obstetrics & Gynecology

## 2023-03-08 VITALS — BP 97/66 | HR 86 | Wt 119.0 lb

## 2023-03-08 DIAGNOSIS — Z3403 Encounter for supervision of normal first pregnancy, third trimester: Secondary | ICD-10-CM

## 2023-03-08 DIAGNOSIS — Z3A38 38 weeks gestation of pregnancy: Secondary | ICD-10-CM

## 2023-03-08 NOTE — Patient Instructions (Signed)

## 2023-03-08 NOTE — Progress Notes (Signed)
   LOW-RISK PREGNANCY VISIT Patient name: Jennifer Alexander MRN [id number]  Date of birth: 2001/01/22 Chief Complaint:   Routine Prenatal Visit (Cervix check)  History of Present Illness:   Jennifer Alexander is a 22 y.o. G1P0 female at [redacted]w[redacted]d with an Estimated Date of Delivery: 03/16/23 being seen today for ongoing management of a low-risk pregnancy.  Today she reports no complaints. Contractions: Not present.  .  Movement: Present. denies leaking of fluid. Review of Systems:   Pertinent items are noted in HPI Denies abnormal vaginal discharge w/ itching/odor/irritation, headaches, visual changes, shortness of breath, chest pain, abdominal pain, severe nausea/vomiting, or problems with urination or bowel movements unless otherwise stated above. Pertinent History Reviewed:  Reviewed past medical,surgical, social, obstetrical and family history.  Reviewed problem list, medications and allergies. Physical Assessment:   Vitals:   03/08/23 1035  BP: 97/66  Pulse: 86  Weight: 119 lb (54 kg)  Body mass index is 23.24 kg/m.        Physical Examination:   General appearance: Well appearing, and in no distress  Mental status: Alert, oriented to person, place, and time  Skin: Warm & dry  Cardiovascular: Normal heart rate noted  Respiratory: Normal respiratory effort, no distress  Abdomen: Soft, gravid, nontender  Pelvic: Cervical exam performed  Dilation: 3 Effacement (%): 80 Station: -2  Extremities: Edema: None  Fetal Status: Fetal Heart Rate (bpm): 122 Fundal Height: 38 cm Movement: Present Presentation: Vertex  Chaperone:  Peggy Dones    No results found for this or any previous visit (from the past 24 hour(s)).  Assessment & Plan:    Pregnancy: G1P0 at [redacted]w[redacted]d 1. Encounter for supervision of normal first pregnancy in third trimester   2. [redacted] weeks gestation of pregnancy   Wants membranse stripped >39 weeks   Meds: No orders of the defined types were placed in this  encounter.  Labs/procedures today: none  Plan:  Continue routine obstetrical care  Next visit: prefers in person    Reviewed: Term labor symptoms and general obstetric precautions including but not limited to vaginal bleeding, contractions, leaking of fluid and fetal movement were reviewed in detail with the patient.  All questions were answered.  Follow-up: Return for monday for LROB.  Future Appointments  Date Time Provider [place]  03/15/2023 10:50 AM Cresenzo-Dishmon, Joaquim Lai, CNM [facility]    No orders of the defined types were placed in this encounter.  Christin Fudge DNP, CNM 03/08/2023 11:10 AM

## 2023-03-12 ENCOUNTER — Ambulatory Visit (INDEPENDENT_AMBULATORY_CARE_PROVIDER_SITE_OTHER): Payer: Medicaid Other | Admitting: Women's Health

## 2023-03-12 ENCOUNTER — Encounter: Payer: Self-pay | Admitting: Women's Health

## 2023-03-12 VITALS — BP 114/68 | HR 102 | Wt 119.8 lb

## 2023-03-12 DIAGNOSIS — Z3403 Encounter for supervision of normal first pregnancy, third trimester: Secondary | ICD-10-CM

## 2023-03-12 DIAGNOSIS — Z3A39 39 weeks gestation of pregnancy: Secondary | ICD-10-CM

## 2023-03-12 NOTE — Patient Instructions (Signed)
Jennifer Alexander, thank you for choosing our office today! We appreciate the opportunity to meet your healthcare needs. You may receive a short survey by mail, e-mail, or through [employer]. If you are happy with your care we would appreciate if you could take just a few minutes to complete the survey questions. We read all of your comments and take your feedback very seriously. Thank you again for choosing our office.  Center for [employer] Team at [facility]  [facility] ([address] [place], [place] [address]) Entrance C, located off of [address] Free 24/7 valet parking   CLASSES: Go to [email or web address] to register for classes (childbirth, breastfeeding, waterbirth, infant CPR, daddy bootcamp, etc.)  Call the office [phone]) or go to [facility] if: You begin to have strong, frequent contractions Your water breaks.  Sometimes it is a big gush of fluid, sometimes it is just a trickle that keeps getting your panties wet or running down your legs You have vaginal bleeding.  It is normal to have a small amount of spotting if your cervix was checked.  You don't feel your baby moving like normal.  If you don't, get you something to eat and drink and lay down and focus on feeling your baby move.   If your baby is still not moving like normal, you should call the office or go to [facility].  Call the office [phone]) or go to [facility] hospital for these signs of pre-eclampsia: Severe headache that does not go away with Tylenol Visual changes- seeing spots, double, blurred vision Pain under your right breast or upper abdomen that does not go away with Tums or heartburn medicine Nausea and/or vomiting Severe swelling in your hands, feet, and face   [facility] Pediatricians/Family Doctors [facility]): [address] Colette Ribas, [phone]           [facility]: [address], [phone]                 [facility]): [address], [phone] (call to ask if accepting patients) [facility] Department: [address] [place], [phone]    [facility] Pediatricians/Family Doctors [facility]): [address], [phone] [facility]: [address] [place], [phone] [facility]: [address], [phone]  [facility] Doctors  [facility]): [phone] [facility]: [address], [phone]   [facility] Doctors [facility]: [address], [phone]  [facility] Doctors  [facility]: [phone], [phone]  Home Blood Pressure Monitoring for Patients   Your provider has recommended that you check your blood pressure (BP) at least once a week at home. If you do not have a blood pressure cuff at home, one will be provided for you. Contact your provider if you have not received your monitor within 1 week.   Helpful Tips for Accurate Home Blood Pressure Checks  Don't smoke, exercise, or drink caffeine 30 minutes before checking your BP Use the restroom before checking your BP (a full bladder can raise your pressure) Relax in a comfortable upright chair Feet on the ground Left arm resting comfortably on a flat surface at the level of your heart Legs uncrossed Back supported Sit quietly and don't talk Place the cuff on your bare arm Adjust snuggly, so that only two fingertips  can fit between your skin and the top of the cuff Check 2 readings separated by at least one minute Keep a log of your BP readings For a visual, please reference this diagram: http://ccnc.care/bpdiagram  Provider Name: [facility]     Phone: [phone]  Zone 1: ALL CLEAR  Continue to monitor your symptoms:  BP reading is less than 140 (top number) or less than 90 (bottom number)  No right  upper stomach pain No headaches or seeing spots No feeling nauseated or throwing up No swelling in face and hands  Zone 2: CAUTION Call your doctor's office for any of the following:  BP reading is greater than 140 (top number) or greater than 90 (bottom number)  Stomach pain under your ribs in the middle or right side Headaches or seeing spots Feeling nauseated or throwing up Swelling in face and hands  Zone 3: EMERGENCY  Seek immediate medical care if you have any of the following:  BP reading is greater than160 (top number) or greater than 110 (bottom number) Severe headaches not improving with Tylenol Serious difficulty catching your breath Any worsening symptoms from Zone 2   Braxton Hicks Contractions Contractions of the uterus can occur throughout pregnancy, but they are not always a sign that you are in labor. You may have practice contractions called Braxton Hicks contractions. These false labor contractions are sometimes confused with true labor. What are [name] contractions? Braxton Hicks contractions are tightening movements that occur in the muscles of the uterus before labor. Unlike true labor contractions, these contractions do not result in opening (dilation) and thinning of the cervix. Toward the end of pregnancy (32-34 weeks), Braxton Hicks contractions can happen more often and may become stronger. These contractions are sometimes difficult to tell apart from true labor because they can be very uncomfortable. You should not feel embarrassed if you go to the hospital with false labor. Sometimes, the only way to tell if you are in true labor is for your health care provider to look for changes in the cervix. The health care provider will do a physical exam and may monitor your contractions. If you are not in true labor, the exam should show that your cervix is not dilating and your water has not broken. If there are no other health problems associated with your  pregnancy, it is completely safe for you to be sent home with false labor. You may continue to have Braxton Hicks contractions until you go into true labor. How to tell the difference between true labor and false labor True labor Contractions last 30-70 seconds. Contractions become very regular. Discomfort is usually felt in the top of the uterus, and it spreads to the lower abdomen and low back. Contractions do not go away with walking. Contractions usually become more intense and increase in frequency. The cervix dilates and gets thinner. False labor Contractions are usually shorter and not as strong as true labor contractions. Contractions are usually irregular. Contractions are often felt in the front of the lower abdomen and in the groin. Contractions may go away when you walk around or change positions while lying down. Contractions get weaker and are shorter-lasting as time goes on. The cervix usually does not dilate or become thin. Follow these instructions at home:  Take over-the-counter and prescription medicines only as told by your health care provider. Keep up with your usual exercises and follow other instructions from your health care provider. Eat and drink lightly if you think  you are going into labor. If Braxton Hicks contractions are making you uncomfortable: Change your position from lying down or resting to walking, or change from walking to resting. Sit and rest in a tub of warm water. Drink enough fluid to keep your urine pale yellow. Dehydration may cause these contractions. Do slow and deep breathing several times an hour. Keep all follow-up prenatal visits as told by your health care provider. This is important. Contact a health care provider if: You have a fever. You have continuous pain in your abdomen. Get help right away if: Your contractions become stronger, more regular, and closer together. You have fluid leaking or gushing from your vagina. You pass  blood-tinged mucus (bloody show). You have bleeding from your vagina. You have low back pain that you never had before. You feel your baby's head pushing down and causing pelvic pressure. Your baby is not moving inside you as much as it used to. Summary Contractions that occur before labor are called Braxton Hicks contractions, false labor, or practice contractions. Braxton Hicks contractions are usually shorter, weaker, farther apart, and less regular than true labor contractions. True labor contractions usually become progressively stronger and regular, and they become more frequent. Manage discomfort from [facility] contractions by changing position, resting in a warm bath, drinking plenty of water, or practicing deep breathing. This information is not intended to replace advice given to you by your health care provider. Make sure you discuss any questions you have with your health care provider. Document Revised: 11/02/2017 Document Reviewed: 04/05/2017 Elsevier Patient Education  [place].

## 2023-03-12 NOTE — Progress Notes (Signed)
LOW-RISK PREGNANCY VISIT Patient name: Jennifer Alexander MRN [id number]  Date of birth: [birth year] Chief Complaint:   Routine Prenatal Visit  History of Present Illness:   Jennifer Alexander is a 22 y.o. G1P0 female at [redacted]w[redacted]d with an Estimated Date of Delivery: 03/16/23 being seen today for ongoing management of a low-risk pregnancy.   Scheduled today for membrane sweep. Today she reports  contractions last couple of days . Contractions: Irritability. Vag. Bleeding: None.  Movement: Present. denies leaking of fluid.     12/28/2022   11:33 AM 03/24/2022   12:36 PM 07/17/2019   11:11 AM 01/15/2019    5:32 PM 08/14/2018    3:47 PM  Depression screen PHQ 2/9  Decreased Interest 0 0 0 0 0  Down, Depressed, Hopeless 0 0 0 0 0  PHQ - 2 Score 0 0 0 0 0  Altered sleeping 0 1 0  0  Tired, decreased energy 0 1 0  0  Change in appetite 0 1 0  0  Feeling bad or failure about yourself  0 0 0  0  Trouble concentrating 0 0 0  0  Moving slowly or fidgety/restless 0 0 0  0  Suicidal thoughts 0 0 0  0  PHQ-9 Score 0 3 0  0  Difficult doing work/chores  Somewhat difficult           12/28/2022   11:33 AM 03/24/2022   12:37 PM  GAD 7 : Generalized Anxiety Score  Nervous, Anxious, on Edge 1 3  Control/stop worrying 0 2  Worry too much - different things 0 0  Trouble relaxing 0 0  Restless 0 0  Easily annoyed or irritable 0 1  Afraid - awful might happen 0 1  Total GAD 7 Score 1 7  Anxiety Difficulty  Somewhat difficult      Review of Systems:   Pertinent items are noted in HPI Denies abnormal vaginal discharge w/ itching/odor/irritation, headaches, visual changes, shortness of breath, chest pain, abdominal pain, severe nausea/vomiting, or problems with urination or bowel movements unless otherwise stated above. Pertinent History Reviewed:  Reviewed past medical,surgical, social, obstetrical and family history.  Reviewed problem list, medications and allergies. Physical Assessment:   Vitals:    03/12/23 1336  BP: 114/68  Pulse: (!) 102  Weight: 119 lb 12.8 oz (54.3 kg)  Body mass index is 23.4 kg/m.        Physical Examination:   General appearance: Well appearing, and in no distress  Mental status: Alert, oriented to person, place, and time  Skin: Warm & dry  Cardiovascular: Normal heart rate noted  Respiratory: Normal respiratory effort, no distress  Abdomen: Soft, gravid, nontender  Pelvic: Cervical exam performed  Dilation: 3 Effacement (%): 80 Station: -2, Offered membrane sweeping, discussed r/b- pt decided to proceed, so membranes swept.   Extremities:    Fetal Status: Fetal Heart Rate (bpm): 125 Fundal Height: 37 cm Movement: Present Presentation: Vertex  Chaperone: Faith Rogue   No results found for this or any previous visit (from the past 24 hour(s)).  Assessment & Plan:  1) Low-risk pregnancy G1P0 at [redacted]w[redacted]d with an Estimated Date of Delivery: 03/16/23   2) Membrane sweep, per request   Meds: No orders of the defined types were placed in this encounter.  Labs/procedures today: SVE and membrane sweep  Plan:  Continue routine obstetrical care  Next visit: prefers in person    Reviewed: Term labor symptoms and general obstetric precautions including but  not limited to vaginal bleeding, contractions, leaking of fluid and fetal movement were reviewed in detail with the patient.  All questions were answered. Does have home bp cuff. Office bp cuff given: not applicable. Check bp weekly, let us know if consistently >140 and/or >90.  Follow-up: Return for As scheduled.  Future Appointments  Date Time Provider Department Center  03/15/2023 10:50 AM Cresenzo-Dishmon, Scarlette Calico, CNM [facility]    No orders of the defined types were placed in this encounter.  Cheral Marker CNM, [facility] 03/12/2023 1:46 PM

## 2023-03-15 ENCOUNTER — Ambulatory Visit (INDEPENDENT_AMBULATORY_CARE_PROVIDER_SITE_OTHER): Payer: Medicaid Other | Admitting: Advanced Practice Midwife

## 2023-03-15 VITALS — BP 79/50 | HR 89 | Wt 124.4 lb

## 2023-03-15 DIAGNOSIS — Z3A39 39 weeks gestation of pregnancy: Secondary | ICD-10-CM

## 2023-03-15 DIAGNOSIS — Z3403 Encounter for supervision of normal first pregnancy, third trimester: Secondary | ICD-10-CM

## 2023-03-15 NOTE — Progress Notes (Signed)
   LOW-RISK PREGNANCY VISIT Patient name: Jennifer Alexander MRN [id number]  Date of birth: 2001/07/14 Chief Complaint:   Routine Prenatal Visit  History of Present Illness:   Jennifer Alexander is a 22 y.o. G1P0 female at [redacted]w[redacted]d with an Estimated Date of Delivery: 03/16/23 being seen today for ongoing management of a low-risk pregnancy.  Today she reports no complaints. Contractions: Irregular.  .  Movement: Present. denies leaking of fluid. Review of Systems:   Pertinent items are noted in HPI Denies abnormal vaginal discharge w/ itching/odor/irritation, headaches, visual changes, shortness of breath, chest pain, abdominal pain, severe nausea/vomiting, or problems with urination or bowel movements unless otherwise stated above. Pertinent History Reviewed:  Reviewed past medical,surgical, social, obstetrical and family history.  Reviewed problem list, medications and allergies. Physical Assessment:   Vitals:   03/15/23 1056  BP: (!) 79/50  Pulse: 89  Weight: 124 lb 6.4 oz (56.4 kg)  Body mass index is 24.3 kg/m.        Physical Examination:   General appearance: Well appearing, and in no distress  Mental status: Alert, oriented to person, place, and time  Skin: Warm & dry  Cardiovascular: Normal heart rate noted  Respiratory: Normal respiratory effort, no distress  Abdomen: Soft, gravid, nontender  Pelvic: Cervical exam performed  Dilation: 3.5 Effacement (%): 90 Station: -2  Extremities: Edema: None  Fetal Status: Fetal Heart Rate (bpm): 128 Fundal Height: 38 cm Movement: Present Presentation: Vertex  Chaperone:  Peggy Dones    No results found for this or any previous visit (from the past 24 hour(s)).  Assessment & Plan:    Pregnancy: G1P0 at [redacted]w[redacted]d 1. Encounter for supervision of normal first pregnancy in third trimester   2. [redacted] weeks gestation of pregnancy      Meds: No orders of the defined types were placed in this encounter.  Labs/procedures today: none  Plan:   Continue routine obstetrical care none Next visit: prefers will be in person for NST     Reviewed: Term labor symptoms and general obstetric precautions including but not limited to vaginal bleeding, contractions, leaking of fluid and fetal movement werHase reviewed in detail with the patient.  All questions were answered. Has home bp cuff. . Check bp weekly, let us know if >140/90.   Follow-up: Return for monday for NST, Thursday for LROB/NST.  No future appointments.  No orders of the defined types were placed in this encounter.  Jacklyn Shell DNP, CNM 03/15/2023 11:16 AM

## 2023-03-15 NOTE — Patient Instructions (Signed)

## 2023-03-16 ENCOUNTER — Encounter (HOSPITAL_COMMUNITY): Payer: Self-pay | Admitting: Obstetrics & Gynecology

## 2023-03-16 ENCOUNTER — Other Ambulatory Visit: Payer: Self-pay

## 2023-03-16 ENCOUNTER — Inpatient Hospital Stay (HOSPITAL_COMMUNITY)
Admission: AD | Admit: 2023-03-16 | Discharge: 2023-03-18 | DRG: 807 | Disposition: A | Discharge status: Home / Self Care | Payer: Medicaid Other | Attending: Obstetrics & Gynecology | Admitting: Obstetrics & Gynecology

## 2023-03-16 ENCOUNTER — Inpatient Hospital Stay (HOSPITAL_COMMUNITY): Payer: Medicaid Other | Admitting: Anesthesiology

## 2023-03-16 DIAGNOSIS — O99824 Streptococcus B carrier state complicating childbirth: Secondary | ICD-10-CM | POA: Diagnosis not present

## 2023-03-16 DIAGNOSIS — Z3A40 40 weeks gestation of pregnancy: Secondary | ICD-10-CM

## 2023-03-16 DIAGNOSIS — O26893 Other specified pregnancy related conditions, third trimester: Secondary | ICD-10-CM | POA: Diagnosis not present

## 2023-03-16 DIAGNOSIS — Z3400 Encounter for supervision of normal first pregnancy, unspecified trimester: Secondary | ICD-10-CM

## 2023-03-16 DIAGNOSIS — O480 Post-term pregnancy: Secondary | ICD-10-CM | POA: Diagnosis not present

## 2023-03-16 DIAGNOSIS — O368390 Maternal care for abnormalities of the fetal heart rate or rhythm, unspecified trimester, not applicable or unspecified: Secondary | ICD-10-CM | POA: Diagnosis present

## 2023-03-16 DIAGNOSIS — O99820 Streptococcus B carrier state complicating pregnancy: Secondary | ICD-10-CM | POA: Diagnosis not present

## 2023-03-16 DIAGNOSIS — Z3403 Encounter for supervision of normal first pregnancy, third trimester: Principal | ICD-10-CM

## 2023-03-16 LAB — CBC
HCT: 28.2 % — ABNORMAL LOW (ref 36.0–46.0)
Hemoglobin: 8.7 g/dL — ABNORMAL LOW (ref 12.0–15.0)
MCH: 24.8 pg — ABNORMAL LOW (ref 26.0–34.0)
MCHC: 30.9 g/dL (ref 30.0–36.0)
MCV: 80.3 fL (ref 80.0–100.0)
Platelets: 292 10*3/uL (ref 150–400)
RBC: 3.51 MIL/uL — ABNORMAL LOW (ref 3.87–5.11)
RDW: 15.8 % — ABNORMAL HIGH (ref 11.5–15.5)
WBC: 9.3 10*3/uL (ref 4.0–10.5)
nRBC: 0.2 % (ref 0.0–0.2)

## 2023-03-16 MED ORDER — LACTATED RINGERS IV SOLN
500.0000 mL | INTRAVENOUS | Status: DC | PRN
  Administered 2023-03-16: 500 mL via INTRAVENOUS

## 2023-03-16 MED ORDER — LIDOCAINE HCL (PF) 1 % IJ SOLN: 30.0000 mL | INTRAMUSCULAR | Status: DC | PRN

## 2023-03-16 MED ORDER — OXYTOCIN BOLUS FROM INFUSION
333.0000 mL | Freq: Once | INTRAVENOUS | Status: DC
  Administered 2023-03-17: 333 mL via INTRAVENOUS

## 2023-03-16 MED ORDER — LACTATED RINGERS IV SOLN: INTRAVENOUS | Status: DC

## 2023-03-16 MED ORDER — PENICILLIN G POT IN DEXTROSE 60000 UNIT/ML IV SOLN
3.0000 10*6.[IU] | INTRAVENOUS | Status: DC
  Administered 2023-03-16 – 2023-03-17 (×3): 3 10*6.[IU] via INTRAVENOUS
  Filled 2023-03-16 (×3): qty 50

## 2023-03-16 MED ORDER — OXYCODONE-ACETAMINOPHEN 5-325 MG PO TABS: 2.0000 | ORAL_TABLET | ORAL | Status: DC | PRN

## 2023-03-16 MED ORDER — SODIUM CHLORIDE 0.9 % IV SOLN
5.0000 10*6.[IU] | Freq: Once | INTRAVENOUS | Status: AC
  Administered 2023-03-16: 5 10*6.[IU] via INTRAVENOUS
  Filled 2023-03-16: qty 5

## 2023-03-16 MED ORDER — EPHEDRINE 5 MG/ML INJ: 10.0000 mg | INTRAVENOUS | Status: DC | PRN

## 2023-03-16 MED ORDER — ONDANSETRON HCL 4 MG/2ML IJ SOLN: 4.0000 mg | Freq: Four times a day (QID) | INTRAMUSCULAR | Status: DC | PRN

## 2023-03-16 MED ORDER — SOD CITRATE-CITRIC ACID 500-334 MG/5ML PO SOLN: 30.0000 mL | ORAL | Status: DC | PRN

## 2023-03-16 MED ORDER — LACTATED RINGERS IV SOLN: 500.0000 mL | Freq: Once | INTRAVENOUS | Status: DC

## 2023-03-16 MED ORDER — LIDOCAINE HCL (PF) 1 % IJ SOLN
INTRAMUSCULAR | Status: DC | PRN
  Administered 2023-03-16: 5 mL via EPIDURAL
  Administered 2023-03-16: 3 mL via EPIDURAL

## 2023-03-16 MED ORDER — FENTANYL CITRATE (PF) 100 MCG/2ML IJ SOLN
50.0000 ug | INTRAMUSCULAR | Status: DC | PRN
  Administered 2023-03-16: 50 ug via INTRAVENOUS

## 2023-03-16 MED ORDER — PHENYLEPHRINE 80 MCG/ML (10ML) SYRINGE FOR IV PUSH (FOR BLOOD PRESSURE SUPPORT)
80.0000 ug | PREFILLED_SYRINGE | INTRAVENOUS | Status: DC | PRN
  Administered 2023-03-16: 80 ug via INTRAVENOUS

## 2023-03-16 MED ORDER — ACETAMINOPHEN 325 MG PO TABS: 650.0000 mg | ORAL_TABLET | ORAL | Status: DC | PRN

## 2023-03-16 MED ORDER — OXYCODONE-ACETAMINOPHEN 5-325 MG PO TABS: 1.0000 | ORAL_TABLET | ORAL | Status: DC | PRN

## 2023-03-16 MED ORDER — FENTANYL CITRATE (PF) 100 MCG/2ML IJ SOLN
50.0000 ug | INTRAMUSCULAR | Status: DC | PRN
  Filled 2023-03-16: qty 2

## 2023-03-16 MED ORDER — PHENYLEPHRINE 80 MCG/ML (10ML) SYRINGE FOR IV PUSH (FOR BLOOD PRESSURE SUPPORT)
80.0000 ug | PREFILLED_SYRINGE | INTRAVENOUS | Status: DC | PRN
  Filled 2023-03-16: qty 10

## 2023-03-16 MED ORDER — FENTANYL-BUPIVACAINE-NACL 0.5-0.125-0.9 MG/250ML-% EP SOLN
12.0000 mL/h | EPIDURAL | Status: DC | PRN
  Administered 2023-03-16: 12 mL/h via EPIDURAL
  Filled 2023-03-16: qty 250

## 2023-03-16 MED ORDER — OXYTOCIN-SODIUM CHLORIDE 30-0.9 UT/500ML-% IV SOLN
2.5000 [IU]/h | INTRAVENOUS | Status: DC
  Filled 2023-03-16: qty 500

## 2023-03-16 MED ORDER — DIPHENHYDRAMINE HCL 50 MG/ML IJ SOLN: 12.5000 mg | INTRAMUSCULAR | Status: DC | PRN

## 2023-03-16 NOTE — Anesthesia Procedure Notes (Signed)
Epidural Patient location during procedure: OB Start time: 03/16/2023 5:06 PM End time: 03/16/2023 5:11 PM  Staffing Anesthesiologist: Linton Rump, MD Performed: anesthesiologist   Preanesthetic Checklist Completed: patient identified, IV checked, site marked, risks and benefits discussed, surgical consent, monitors and equipment checked, pre-op evaluation and timeout performed  Epidural Patient position: sitting Prep: DuraPrep and site prepped and draped Patient monitoring: continuous pulse ox and blood pressure Approach: midline Location: L3-L4 Injection technique: LOR saline  Needle:  Needle type: Tuohy  Needle gauge: 17 G Needle length: 9 cm and 9 Needle insertion depth: 4 cm Catheter type: closed end flexible Catheter size: 19 Gauge Catheter at skin depth: 8 cm Test dose: negative  Assessment Events: blood not aspirated, no cerebrospinal fluid, injection not painful, no injection resistance, no paresthesia and negative IV test  Additional Notes The patient has requested an epidural for labor pain management. Risks and benefits including, but not limited to, infection, bleeding, local anesthetic toxicity, headache, hypotension, back pain, block failure, etc. were discussed with the patient. The patient expressed understanding and consented to the procedure. I confirmed that the patient has no bleeding disorders and is not taking blood thinners. I confirmed the patient's last platelet count with the nurse. A time-out was performed immediately prior to the procedure. Please see nursing documentation for vital signs. Sterile technique was used throughout the whole procedure. Once LOR achieved, the epidural catheter threaded easily without resistance. Aspiration of the catheter was negative for blood and CSF. The epidural was dosed slowly and an infusion was started.  1 attempt(s)Reason for block:procedure for pain

## 2023-03-16 NOTE — MAU Note (Signed)
.  Jennifer Alexander is a 22 y.o. at [redacted]w[redacted]d here in MAU reporting: Pt reports ctx's that's started at 1100. Pt reports 4cm yesterday.  Denies vaginal bleeding, Denies LOF.  REPORTS +fm   Onset of complaint: today Pain score: 10/10 There were no vitals filed for this visit.   Lab orders placed from triage:   none

## 2023-03-16 NOTE — Anesthesia Preprocedure Evaluation (Signed)
Anesthesia Evaluation  Patient identified by MRN, date of birth, ID band Patient awake    Reviewed: Allergy & Precautions, NPO status , Patient's Chart, lab work & pertinent test results  History of Anesthesia Complications Negative for: history of anesthetic complications  Airway Mallampati: II  TM Distance: >3 FB Neck ROM: Full    Dental   Pulmonary neg pulmonary ROS   Pulmonary exam normal breath sounds clear to auscultation       Cardiovascular negative cardio ROS  Rhythm:Regular Rate:Normal     Neuro/Psych negative neurological ROS     GI/Hepatic Neg liver ROS,GERD  ,,  Endo/Other  negative endocrine ROS    Renal/GU negative Renal ROS     Musculoskeletal   Abdominal   Peds  Hematology negative hematology ROS (+)   Anesthesia Other Findings 22 y.o. G1P0 at [redacted]w[redacted]d here for contractions, noted to have NRFHT with bradycardia and variable decels in MAU, but tracing improved upon admission with low-normal baseline FHR around 115 and no decels  Reproductive/Obstetrics (+) Pregnancy                              Anesthesia Physical Anesthesia Plan  ASA: 2  Anesthesia Plan: Epidural   Post-op Pain Management:    Induction:   PONV Risk Score and Plan:   Airway Management Planned:   Additional Equipment:   Intra-op Plan:   Post-operative Plan:   Informed Consent: I have reviewed the patients History and Physical, chart, labs and discussed the procedure including the risks, benefits and alternatives for the proposed anesthesia with the patient or authorized representative who has indicated his/her understanding and acceptance.       Plan Discussed with: Anesthesiologist  Anesthesia Plan Comments: (I have discussed risks of neuraxial anesthesia including but not limited to infection, bleeding, nerve injury, back pain, headache, seizures, and failure of block. Patient denies  bleeding disorders and is not currently anticoagulated. Labs have been reviewed. Risks and benefits discussed. All patient's questions answered.  )         Anesthesia Quick Evaluation

## 2023-03-16 NOTE — H&P (Cosign Needed Addendum)
OBSTETRIC ADMISSION HISTORY AND PHYSICAL  Jennifer Alexander is a 22 y.o. female G1P0 with IUP at [redacted]w[redacted]d by LMP c/w early [place] presenting for contractions. She reports +FMs, No LOF, no VB, no blurry vision, headaches or peripheral edema, and RUQ pain.  She plans on bottle feeding. She request COCs for birth control. She received her prenatal care at [facility] starting at 28wks, initiated care at 13w with [facility]  Dating: By LMP c/w [place] --->  Estimated Date of Delivery: 03/16/23  Sono:    , CWD, normal anatomy, breech presentation, 312g, 14% EFW   Prenatal History/Complications: n/a  Past Medical History: Past Medical History:  Diagnosis Date   Medical history non-contributory     Past Surgical History: Past Surgical History:  Procedure Laterality Date   NO PAST SURGERIES      Obstetrical History: OB History     Gravida  1   Para      Term      Preterm      AB      Living         SAB      IAB      Ectopic      Multiple      Live Births              Social History Social History   Socioeconomic History   Marital status: Single    Spouse name: Not on file   Number of children: Not on file   Years of education: Not on file   Highest education level: Not on file  Occupational History   Not on file  Tobacco Use   Smoking status: Never    Passive exposure: Yes   Smokeless tobacco: Never  Vaping Use   Vaping Use: Never used  Substance and Sexual Activity   Alcohol use: No    Alcohol/week: 0.0 standard drinks of alcohol   Drug use: No   Sexual activity: Yes    Birth control/protection: None  Other Topics Concern   Not on file  Social History Narrative   Not on file   Social Determinants of Health   Financial Resource Strain: Low Risk  (12/28/2022)   Overall Financial Resource Strain (CARDIA)    Difficulty of Paying Living Expenses: Not hard at all  Food Insecurity: No Food Insecurity (12/28/2022)   Hunger Vital Sign    Worried About  Running Out of Food in the Last Year: Never true    Ran Out of Food in the Last Year: Never true  Transportation Needs: No Transportation Needs (12/28/2022)   PRAPARE - Administrator, Civil Service (Medical): No    Lack of Transportation (Non-Medical): No  Physical Activity: Inactive (12/28/2022)   Exercise Vital Sign    Days of Exercise per Week: 0 days    Minutes of Exercise per Session: 0 min  Stress: No Stress Concern Present (12/28/2022)   [employer] of Occupational Health - Occupational Stress Questionnaire    Feeling of Stress : Not at all  Social Connections: Moderately Isolated (12/28/2022)   Social Connection and Isolation Panel [NHANES]    Frequency of Communication with Friends and Family: More than three times a week    Frequency of Social Gatherings with Friends and Family: Never    Attends Religious Services: Never    Database administrator or Organizations: No    Attends Banker Meetings: Never    Marital Status: Living with partner  Family History: Family History  Problem Relation Age of Onset   Breast cancer Maternal Grandmother     Allergies: No Known Allergies  Medications Prior to Admission  Medication Sig Dispense Refill Last Dose   Prenatal Vit-Fe Fumarate-FA (PRENATAL VITAMIN PO) Take by mouth.        Review of Systems   All systems reviewed and negative except as stated in HPI  Blood pressure 116/79, pulse 97, temperature 97.7 F (36.5 C), temperature source Axillary, resp. rate 18, last menstrual period 06/09/2022. General appearance: alert and no distress Lungs: clear to auscultation bilaterally Heart: regular rate and rhythm Abdomen: soft, non-tender; bowel sounds normal Extremities: Homans sign is negative, no sign of DVT Fetal monitoringBaseline: 115 bpm, Variability: Good {> 6 bpm), and Decelerations: Variable: mild Uterine activityFrequency: Every 1-2 minutes Dilation: 4 Effacement (%): 80 Station:  0 Exam by:: Zenia Resides, RN   Prenatal labs: ABO, Rh: O/Positive/-- (08/29 0000) Antibody: Negative (01/26 0849) Rubella: Immune (08/29 0000) RPR: Non Reactive (01/26 0849)  HBsAg: Negative (08/29 0000)  HIV: Non Reactive (01/26 0849)  GBS: Positive/-- (03/21 1200)  1 hr Glucola normal  Genetic screening  Negative NIPS Anatomy US Normal @ [redacted]w[redacted]d  Prenatal Transfer Tool  Maternal Diabetes: No Genetic Screening: Normal Maternal Ultrasounds/Referrals: Normal Fetal Ultrasounds or other Referrals:  None Maternal Substance Abuse:  No Significant Maternal Medications:  None Significant Maternal Lab Results:  Group B Strep positive Number of Prenatal Visits:greater than 3 verified prenatal visits Other Comments:  None  Results for orders placed or performed during the hospital encounter of 03/16/23 (from the past 24 hour(s))  CBC   Collection Time: 03/16/23  3:50 PM  Result Value Ref Range   WBC 9.3 4.0 - 10.5 K/uL   RBC 3.51 (L) 3.87 - 5.11 MIL/uL   Hemoglobin 8.7 (L) 12.0 - 15.0 g/dL   HCT 78.5 (L) 88.5 - 02.7 %   MCV 80.3 80.0 - 100.0 fL   MCH 24.8 (L) 26.0 - 34.0 pg   MCHC 30.9 30.0 - 36.0 g/dL   RDW 74.1 (H) 28.7 - 86.7 %   Platelets 292 150 - 400 K/uL   nRBC 0.2 0.0 - 0.2 %    Patient Active Problem List   Diagnosis Date Noted   Non-reassuring electronic fetal monitoring tracing 03/16/2023   Supervision of normal first pregnancy 12/28/2022   Dysmenorrhea 08/15/2018   Gastroesophageal reflux disease with esophagitis 10/04/2016    Assessment/Plan:  Jennifer Alexander is a 22 y.o. G1P0 at 102w0d here for contractions, noted to have NRFHT with bradycardia and variable decels in MAU, tracing improved upon admission with low-normal baseline FHR around 115  #Labor:Latent, expectant management, anticipate SVD. Consider AROM #Pain: Epidural #FWB: Cat II (variable decels) - IVB, positioning, phenylephrine after epidural #ID:  GBS positive, PCN #MOF: Bottle #MOC: COCs #Circ:   Yes  Vonna Drafts, MD  03/16/2023, 5:15 PM ___ GME ATTESTATION:  Evaluation and management procedures were performed by the [facility] Resident under my supervision. I was immediately available for direct supervision, assistance and direction throughout this encounter.  I also confirm that I have verified the information documented in the resident's note, and that I have also personally reperformed the pertinent components of the physical exam and all of the medical decision making activities.  I have also made any necessary editorial changes.  Myrtie Hawk, DO OB Fellow, Faculty [facility], [facility] 03/16/2023 6:02 PM

## 2023-03-16 NOTE — Progress Notes (Signed)
Labor Progress Note Jennifer Alexander is a 22 y.o. G1P0 at [redacted]w[redacted]d presented for IOL NRFHT.   S: No acute concerns. Recent prolonged decels.   O:  BP 108/67   Pulse 68   Temp 98.6 F (37 C) (Oral)   Resp 16   LMP 06/09/2022  EFM: 120bpm/moderate/+accels, 5 min prolonged decel  CVE: Dilation: 10 Dilation Complete Date: 03/16/23 Dilation Complete Time: 2040 Effacement (%): 100 Station: 0 Presentation: Vertex Exam by:: Deland Pretty RN   A&P: 22 y.o. G1P0 [redacted]w[redacted]d here for IOL due to NRFHT.  #Labor: Progressing. Now in thrown. Good fetal recovery. Plan for vaginal delivery soon #Pain: Epidural #FWB: Cat II, prolonged decel now recovered with maternal positioning #GBS positive   [name] Autry-Lott, DO 8:42 PM

## 2023-03-17 ENCOUNTER — Encounter (HOSPITAL_COMMUNITY): Payer: Self-pay | Admitting: Obstetrics & Gynecology

## 2023-03-17 DIAGNOSIS — O99820 Streptococcus B carrier state complicating pregnancy: Secondary | ICD-10-CM

## 2023-03-17 DIAGNOSIS — O480 Post-term pregnancy: Secondary | ICD-10-CM

## 2023-03-17 DIAGNOSIS — Z3A40 40 weeks gestation of pregnancy: Secondary | ICD-10-CM

## 2023-03-17 LAB — TYPE AND SCREEN
ABO/RH(D): O POS
Antibody Screen: NEGATIVE

## 2023-03-17 LAB — SYPHILIS: RPR W/REFLEX TO RPR TITER AND TREPONEMAL ANTIBODIES, TRADITIONAL SCREENING AND DIAGNOSIS ALGORITHM: RPR Ser Ql: NONREACTIVE

## 2023-03-17 MED ORDER — ACETAMINOPHEN 325 MG PO TABS: 650.0000 mg | ORAL_TABLET | ORAL | Status: DC | PRN

## 2023-03-17 MED ORDER — SENNOSIDES-DOCUSATE SODIUM 8.6-50 MG PO TABS
2.0000 | ORAL_TABLET | Freq: Every day | ORAL | Status: DC
  Administered 2023-03-18: 2 via ORAL
  Filled 2023-03-17: qty 2

## 2023-03-17 MED ORDER — TERBUTALINE SULFATE 1 MG/ML IJ SOLN
INTRAMUSCULAR | Status: AC
  Administered 2023-03-17: 0.25 mg via SUBCUTANEOUS
  Filled 2023-03-17: qty 1

## 2023-03-17 MED ORDER — TETANUS-DIPHTH-ACELL PERTUSSIS 5-2.5-18.5 LF-MCG/0.5 IM SUSY: 0.5000 mL | PREFILLED_SYRINGE | Freq: Once | INTRAMUSCULAR | Status: DC

## 2023-03-17 MED ORDER — ONDANSETRON HCL 4 MG/2ML IJ SOLN
4.0000 mg | INTRAMUSCULAR | Status: DC | PRN
  Administered 2023-03-17: 4 mg via INTRAVENOUS
  Filled 2023-03-17: qty 2

## 2023-03-17 MED ORDER — BENZOCAINE-MENTHOL 20-0.5 % EX AERO
1.0000 | INHALATION_SPRAY | CUTANEOUS | Status: DC | PRN
  Administered 2023-03-17: 1 via TOPICAL
  Filled 2023-03-17: qty 56

## 2023-03-17 MED ORDER — TERBUTALINE SULFATE 1 MG/ML IJ SOLN
0.2500 mg | Freq: Once | INTRAMUSCULAR | Status: AC
  Administered 2023-03-17: 0.25 mg via SUBCUTANEOUS

## 2023-03-17 MED ORDER — SIMETHICONE 80 MG PO CHEW: 80.0000 mg | CHEWABLE_TABLET | ORAL | Status: DC | PRN

## 2023-03-17 MED ORDER — DIBUCAINE (PERIANAL) 1 % EX OINT: 1.0000 | TOPICAL_OINTMENT | CUTANEOUS | Status: DC | PRN

## 2023-03-17 MED ORDER — PRENATAL MULTIVITAMIN CH
1.0000 | ORAL_TABLET | Freq: Every day | ORAL | Status: DC
  Administered 2023-03-18: 1 via ORAL
  Filled 2023-03-17: qty 1

## 2023-03-17 MED ORDER — ONDANSETRON HCL 4 MG PO TABS: 4.0000 mg | ORAL_TABLET | ORAL | Status: DC | PRN

## 2023-03-17 MED ORDER — ZOLPIDEM TARTRATE 5 MG PO TABS: 5.0000 mg | ORAL_TABLET | Freq: Every evening | ORAL | Status: DC | PRN

## 2023-03-17 MED ORDER — IBUPROFEN 600 MG PO TABS
600.0000 mg | ORAL_TABLET | Freq: Four times a day (QID) | ORAL | Status: DC
  Administered 2023-03-17 – 2023-03-18 (×5): 600 mg via ORAL
  Filled 2023-03-17 (×5): qty 1

## 2023-03-17 MED ORDER — COCONUT OIL OIL: 1.0000 | TOPICAL_OIL | Status: DC | PRN

## 2023-03-17 MED ORDER — DIPHENHYDRAMINE HCL 25 MG PO CAPS: 25.0000 mg | ORAL_CAPSULE | Freq: Four times a day (QID) | ORAL | Status: DC | PRN

## 2023-03-17 MED ORDER — WITCH HAZEL-GLYCERIN EX PADS: 1.0000 | MEDICATED_PAD | CUTANEOUS | Status: DC | PRN

## 2023-03-17 MED ORDER — TERBUTALINE SULFATE 1 MG/ML IJ SOLN: 0.2500 mg | Freq: Once | INTRAMUSCULAR | Status: AC

## 2023-03-17 NOTE — Anesthesia Postprocedure Evaluation (Signed)
Anesthesia Post Note  Patient: Jennifer Alexander  Procedure(s) Performed: AN AD HOC LABOR EPIDURAL     Patient location during evaluation: Mother Baby Anesthesia Type: Epidural Level of consciousness: awake and alert and oriented Pain management: satisfactory to patient Vital Signs Assessment: post-procedure vital signs reviewed and stable Respiratory status: respiratory function stable Cardiovascular status: stable Postop Assessment: no headache, no backache, epidural receding, patient able to bend at knees, no signs of nausea or vomiting, adequate PO intake and able to ambulate Anesthetic complications: no   No notable events documented.  Last Vitals:  Vitals:   03/17/23 1051 03/17/23 1527  BP: 95/61 97/63  Pulse: 84 77  Resp:  17  Temp: 36.7 C 36.8 C  SpO2: 98% 99%    Last Pain:  Vitals:   03/17/23 1527  TempSrc: Oral  PainSc: 0-No pain   Pain Goal:                   Aileene Lanum

## 2023-03-17 NOTE — Progress Notes (Signed)
Labor Progress Note Jennifer Alexander is a 22 y.o. G1P0 at [redacted]w[redacted]d presented for IOL NRFHT.   S: No acute concerns.   O:  BP 112/73   Pulse 79   Temp 98 F (36.7 C) (Oral)   Resp 16   Ht 5' (1.524 m)   Wt 56.5 kg   LMP 06/09/2022   SpO2 98%   BMI 24.31 kg/m  EFM: 120bpm/moderate/+accels, recurrent prolonged decels  CVE: Dilation: 8 Dilation Complete Date: 03/16/23 Dilation Complete Time: 2040 Effacement (%): 80 Station: 0 Presentation: Vertex Exam by:: Salvadore Dom DO   A&P: 22 y.o. G1P0 [redacted]w[redacted]d here for IOL due to NRFHT.  #Labor: Contracting every 1-3 mins with late and prolonged decels. Terb x2 given.  #Pain: Epidural #FWB: Cat II, prolonged decel now recovered with maternal hands-knees positioning #GBS positive   Jennifer Garraway Autry-Lott, DO 4:30 AM

## 2023-03-17 NOTE — Discharge Summary (Signed)
Postpartum Discharge Summary  Patient Name: Jennifer Alexander DOB: 07-Jun-2001 MRN: [id number]  Date of admission: 03/16/2023 Delivery date:03/17/2023  Delivering provider: Lazaro Arms  Date of discharge: 03/18/2023  Admitting diagnosis: Non-reassuring electronic fetal monitoring tracing [O36.8390] Intrauterine pregnancy: [redacted]w[redacted]d     Secondary diagnosis:  Principal Problem:   Non-reassuring electronic fetal monitoring tracing Active Problems:   Supervision of normal first pregnancy   Vaginal delivery  Additional problems: n/a    Discharge diagnosis: Term Pregnancy Delivered                                              Post partum procedures: none Augmentation: AROM Complications: thick meconium and recurrent decels  Hospital course: Onset of Labor With Vaginal Delivery      21 y.o. yo G1P1001 at [redacted]w[redacted]d was admitted in Latent Labor on 03/16/2023. Labor course was complicated by thick meconium stained fluid and recurrent decels.   Membrane Rupture Time/Date: 11:36 PM ,03/16/2023   Delivery Method:Vaginal, Spontaneous  Episiotomy: None  Lacerations:  None  Patient had an uncomplicated postpartum course.  She is ambulating, tolerating a regular diet, passing flatus, and urinating well. Patient is discharged home in stable condition on 03/18/23.  Newborn Data: Birth date:03/17/2023  Birth time:7:58 AM  Gender:Female  Living status:Living  Apgars:8 ,9  Weight:3780 g   Magnesium Sulfate received: No BMZ received: No Rhophylac:N/A MMR:N/A T-DaP:Given prenatally Flu: N/A Transfusion:No  Physical exam  Vitals:   03/17/23 1527 03/17/23 1916 03/17/23 2321 03/18/23 0521  BP: 97/63 116/63 101/67 110/75  Pulse: 77 81 72 72  Resp: Temp: 98.2 F (36.8 C) 98.6 F (37 C) 98.2 F (36.8 C) 97.8 F (36.6 C)  TempSrc: Oral Oral Oral Oral  SpO2: 99% 100% 99% 100%  Weight:      Height:       General: alert, cooperative, and no distress Lochia: appropriate Uterine  Fundus: firm Incision: N/A DVT Evaluation: No evidence of DVT seen on physical exam. Labs: Lab Results  Component Value Date   WBC 9.3 03/16/2023   HGB 8.7 (L) 03/16/2023   HCT 28.2 (L) 03/16/2023   MCV 80.3 03/16/2023   PLT 292 03/16/2023      Latest Ref Rng & Units 09/19/2017    4:57 PM  CMP  Glucose 65 - 99 mg/dL 119   BUN 5 - 18 mg/dL 6   Creatinine 1.47 - 8.29 mg/dL 5.62   Sodium 130 - 865 mmol/L 145   Potassium 3.5 - 5.2 mmol/L 3.8   Chloride 96 - 106 mmol/L 106   CO2 20 - 29 mmol/L 24   Calcium 8.9 - 10.4 mg/dL 9.3   Total Protein 6.0 - 8.5 g/dL 6.8   Total Bilirubin 0.0 - 1.2 mg/dL <7.8   Alkaline Phos 68 - 209 IU/L 133   AST 0 - 40 IU/L 14   ALT 0 - 24 IU/L 8    Edinburgh Score:    03/18/2023    5:19 AM  Edinburgh Postnatal Depression Scale Screening Tool  I have been able to laugh and see the funny side of things. 0  I have looked forward with enjoyment to things. 0  I have blamed myself unnecessarily when things went wrong. 0  I have been anxious or worried for no good reason. 0  I have felt scared  or panicky for no good reason. 0  Things have been getting on top of me. 0  I have been so unhappy that I have had difficulty sleeping. 0  I have felt sad or miserable. 0  I have been so unhappy that I have been crying. 0  The thought of harming myself has occurred to me. 0  Edinburgh Postnatal Depression Scale Total 0     After visit meds:  Allergies as of 03/18/2023   No Known Allergies      Medication List     TAKE these medications    ibuprofen 600 MG tablet Commonly known as: ADVIL Take 1 tablet (600 mg total) by mouth every 6 (six) hours.   PRENATAL VITAMIN PO Take by mouth.       Discharge home in stable condition Infant Feeding: Bottle Infant Disposition:home with mother Discharge instruction: per After Visit Summary and Postpartum booklet. Activity: Advance as tolerated. Pelvic rest for 6 weeks.  Diet: routine diet  Future  Appointments: Future Appointments  Date Time Provider Department Center  03/19/2023 11:10 AM [facility] NURSE [facility]  03/22/2023 10:10 AM [facility] NURSE [facility]  03/22/2023 10:50 AM Myna Hidalgo, DO [facility]   Follow up Visit:  Message sent to [facility] by Autry-Lott on 03/17/2023  Please schedule this patient for a In person postpartum visit in 6 weeks with the following provider: Any provider. Additional Postpartum F/U:n/a Low risk pregnancy complicated by:  NRFHT Delivery mode:  Vaginal, Spontaneous  Anticipated Birth Control:  OCPs   03/18/2023 Brand Males, CNM

## 2023-03-17 NOTE — Lactation Note (Signed)
This note was copied from a baby's chart. Lactation Consultation Note  Patient Name: Boy Conny Javed BULAG'T Date: 03/17/2023 Age:20 hours  Mom chooses to formula feed.   Maternal Data    Feeding Nipple Type: Slow - flow  LATCH Score                    Lactation Tools Discussed/Used    Interventions    Discharge    Consult Status Consult Status: Complete    Samaj Wessells G 03/17/2023, 7:39 PM

## 2023-03-17 NOTE — Progress Notes (Signed)
Labor Progress Note Jennifer Alexander is a 22 y.o. G1P0 at [redacted]w[redacted]d presented for IOL NRFHT.   S: Called to room by RN for prolonged decel.   O:  BP 118/67   Pulse (!) 106   Temp 98.3 F (36.8 C) (Oral)   Resp 16   LMP 06/09/2022  EFM: 120bpm/moderate/+accels, recurrent 2 min prolonged decels  CVE: Dilation: 6 Dilation Complete Date: 03/16/23 Dilation Complete Time: 2040 Effacement (%): 80 Station: -1 Presentation: Vertex Exam by:: Salvadore Dom DO   A&P: 22 y.o. G1P0 [redacted]w[redacted]d here for IOL due to NRFHT.  #Labor: s/p AROM thick mec. Good fetal recovery with thrown positioning and after terb x1. Contracting every 1-2 mins. Will continue to monitor.  #Pain: Epidural #FWB: Cat II, prolonged decel now recovered with maternal positioning #GBS positive   Jennifer Slotnick Autry-Lott, DO 12:49 AM

## 2023-03-18 MED ORDER — IBUPROFEN 600 MG PO TABS: 600.0000 mg | ORAL_TABLET | Freq: Four times a day (QID) | ORAL | 0 refills | Status: DC

## 2023-03-19 ENCOUNTER — Other Ambulatory Visit: Payer: Medicaid Other

## 2023-03-22 ENCOUNTER — Other Ambulatory Visit: Payer: Medicaid Other

## 2023-03-22 ENCOUNTER — Encounter: Payer: Medicaid Other | Admitting: Obstetrics & Gynecology

## 2023-03-27 ENCOUNTER — Telehealth (HOSPITAL_COMMUNITY): Payer: Self-pay | Admitting: *Deleted

## 2023-03-27 NOTE — Telephone Encounter (Signed)
Attempted hospital discharge follow-up call. Left message for patient to return RN call with any questions or concerns. Deforest Hoyles, RN, 03/27/23, [phone]

## 2023-04-23 ENCOUNTER — Ambulatory Visit: Payer: Medicaid Other | Admitting: Women's Health

## 2023-05-28 ENCOUNTER — Ambulatory Visit (INDEPENDENT_AMBULATORY_CARE_PROVIDER_SITE_OTHER): Payer: Medicaid Other | Admitting: Family Medicine

## 2023-05-28 ENCOUNTER — Encounter: Payer: Self-pay | Admitting: Family Medicine

## 2023-05-28 VITALS — BP 76/45 | HR 70 | Temp 98.0°F | Ht 60.0 in | Wt 107.8 lb

## 2023-05-28 DIAGNOSIS — R42 Dizziness and giddiness: Secondary | ICD-10-CM | POA: Diagnosis not present

## 2023-05-28 DIAGNOSIS — N946 Dysmenorrhea, unspecified: Secondary | ICD-10-CM | POA: Diagnosis not present

## 2023-05-28 DIAGNOSIS — Z3009 Encounter for other general counseling and advice on contraception: Secondary | ICD-10-CM | POA: Diagnosis not present

## 2023-05-28 LAB — PREGNANCY, URINE: Preg Test, Ur: NEGATIVE

## 2023-05-28 MED ORDER — NORGESTIM-ETH ESTRAD TRIPHASIC 0.18/0.215/0.25 MG-25 MCG PO TABS: 1.0000 | ORAL_TABLET | Freq: Every day | ORAL | 11 refills | Status: DC

## 2023-05-28 NOTE — Progress Notes (Signed)
Subjective:  Patient ID: Jennifer Alexander, female    DOB: Apr 11, 2001  Age: 22 y.o. MRN: [id number]  CC: Contraception   HPI [name] presents for resumption of OC post delivery on 03/17/23. She, baby's father and the baby are living with her parents.   Spells when vision starts to fade out, feels faint, light headed and room spins. Onset post partum. Missed her GYN postpartum visit. Child was born April 13. Unremarkable vaginal delivery G1P1.      12/28/2022   11:33 AM 03/24/2022   12:36 PM 07/17/2019   11:11 AM  Depression screen PHQ 2/9  Decreased Interest 0 0 0  Down, Depressed, Hopeless 0 0 0  PHQ - 2 Score 0 0 0  Altered sleeping 0 1 0  Tired, decreased energy 0 1 0  Change in appetite 0 1 0  Feeling bad or failure about yourself  0 0 0  Trouble concentrating 0 0 0  Moving slowly or fidgety/restless 0 0 0  Suicidal thoughts 0 0 0  PHQ-9 Score 0 3 0  Difficult doing work/chores  Somewhat difficult     History Hinata has a past medical history of Medical history non-contributory.   She has a past surgical history that includes No past surgeries.   Her family history includes Breast cancer in her maternal grandmother.She reports that she has never smoked. She has been exposed to tobacco smoke. She has never used smokeless tobacco. She reports that she does not drink alcohol and does not use drugs.    ROS Review of Systems  Constitutional: Negative.   HENT: Negative.    Eyes:  Negative for visual disturbance.  Respiratory:  Negative for shortness of breath.   Cardiovascular:  Negative for chest pain.  Gastrointestinal:  Negative for abdominal pain.  Musculoskeletal:  Negative for arthralgias.    Objective:  BP (!) 76/45   Pulse 70   Temp 98 F (36.7 C)   Ht 5' (1.524 m)   Wt 107 lb 12.8 oz (48.9 kg)   LMP 06/09/2022   SpO2 97%   BMI 21.05 kg/m   BP Readings from Last 3 Encounters:  05/28/23 (!) 76/45  03/18/23 110/75  03/15/23 (!) 79/50    Wt  Readings from Last 3 Encounters:  05/28/23 107 lb 12.8 oz (48.9 kg) (12 %, Z= -1.18)*  03/17/23 124 lb 8 oz (56.5 kg) (44 %, Z= -0.15)*  03/15/23 124 lb 6.4 oz (56.4 kg) (44 %, Z= -0.15)*   * Growth percentiles are based on CDC (Girls, 2-20 Years) data.     Physical Exam Constitutional:      General: She is not in acute distress.    Appearance: She is well-developed.  Cardiovascular:     Rate and Rhythm: Normal rate and regular rhythm.  Pulmonary:     Breath sounds: Normal breath sounds.  Musculoskeletal:        General: Normal range of motion.  Skin:    General: Skin is warm and dry.  Neurological:     Mental Status: She is alert and oriented to person, place, and time.       Assessment & Plan:   Jolynn was seen today for contraception.  Diagnoses and all orders for this visit:  Encounter for other general counseling or advice on contraception -     Cancel: POCT urine pregnancy -     Pregnancy, urine -     Pregnancy, urine  Dysmenorrhea -     Norgestimate-Ethinyl Estradiol  Triphasic (ORTHO TRI-CYCLEN LO) 0.18/0.215/0.25 MG-25 MCG tab; Take 1 tablet by mouth daily.  Dizziness -     CBC with Differential/Platelet -     CMP14+EGFR -     TSH       I have discontinued Kynlie Lanum's Prenatal Vit-Fe Fumarate-FA (PRENATAL VITAMIN PO) and ibuprofen. I am also having her maintain her Norgestimate-Ethinyl Estradiol Triphasic.  Allergies as of 05/28/2023   No Known Allergies      Medication List        Accurate as of May 28, 2023  5:42 PM. If you have any questions, ask your nurse or doctor.          STOP taking these medications    ibuprofen 600 MG tablet Commonly known as: ADVIL Stopped by: Mechele Claude, MD   PRENATAL VITAMIN PO Stopped by: Mechele Claude, MD       TAKE these medications    Norgestimate-Ethinyl Estradiol Triphasic 0.18/0.215/0.25 MG-25 MCG tab Commonly known as: Ortho Tri-Cyclen Lo Take 1 tablet by mouth daily. Started  by: Mechele Claude, MD         Follow-up: Return in about 1 year (around 05/27/2024).  Mechele Claude, M.D.

## 2023-05-29 LAB — CMP14+EGFR
ALT: 13 IU/L (ref 0–32)
AST: 16 IU/L (ref 0–40)
Albumin: 4.3 g/dL (ref 4.0–5.0)
Alkaline Phosphatase: 75 IU/L (ref 42–106)
BUN/Creatinine Ratio: 13 (ref 9–23)
BUN: 11 mg/dL (ref 6–20)
Bilirubin Total: <0.2 mg/dL (ref 0.0–1.2)
CO2: 21 mmol/L (ref 20–29)
Calcium: 9.2 mg/dL (ref 8.7–10.2)
Chloride: 107 mmol/L — ABNORMAL HIGH (ref 96–106)
Creatinine, Ser: 0.87 mg/dL (ref 0.57–1.00)
Globulin, Total: 2.4 g/dL (ref 1.5–4.5)
Glucose: 86 mg/dL (ref 70–99)
Potassium: 4.3 mmol/L (ref 3.5–5.2)
Sodium: 140 mmol/L (ref 134–144)
Total Protein: 6.7 g/dL (ref 6.0–8.5)
eGFR: 98 mL/min/{1.73_m2} (ref >59)

## 2023-05-29 LAB — CBC WITH DIFFERENTIAL/PLATELET
Basophils Absolute: 0.1 10*3/uL (ref 0.0–0.2)
Basos: 1 %
EOS (ABSOLUTE): 0.2 10*3/uL (ref 0.0–0.4)
Eos: 4 %
Hematocrit: 34.3 % (ref 34.0–46.6)
Hemoglobin: 10.7 g/dL — ABNORMAL LOW (ref 11.1–15.9)
Immature Grans (Abs): 0 10*3/uL (ref 0.0–0.1)
Immature Granulocytes: 0 %
Lymphocytes Absolute: 1.9 10*3/uL (ref 0.7–3.1)
Lymphs: 44 %
MCH: 25.6 pg — ABNORMAL LOW (ref 26.6–33.0)
MCHC: 31.2 g/dL — ABNORMAL LOW (ref 31.5–35.7)
MCV: 82 fL (ref 79–97)
Monocytes Absolute: 0.5 10*3/uL (ref 0.1–0.9)
Monocytes: 11 %
Neutrophils Absolute: 1.7 10*3/uL (ref 1.4–7.0)
Neutrophils: 40 %
Platelets: 288 10*3/uL (ref 150–450)
RBC: 4.18 x10E6/uL (ref 3.77–5.28)
RDW: 19.5 % — ABNORMAL HIGH (ref 11.7–15.4)
WBC: 4.2 10*3/uL (ref 3.4–10.8)

## 2023-05-29 LAB — TSH: TSH: 1.34 u[IU]/mL (ref 0.450–4.500)

## 2023-08-31 DIAGNOSIS — H5213 Myopia, bilateral: Secondary | ICD-10-CM | POA: Diagnosis not present

## 2024-01-16 DIAGNOSIS — R197 Diarrhea, unspecified: Secondary | ICD-10-CM | POA: Diagnosis not present

## 2024-01-16 DIAGNOSIS — B349 Viral infection, unspecified: Secondary | ICD-10-CM | POA: Diagnosis not present

## 2024-01-16 DIAGNOSIS — R0981 Nasal congestion: Secondary | ICD-10-CM | POA: Diagnosis not present

## 2024-04-01 ENCOUNTER — Telehealth: Payer: Self-pay | Admitting: Family Medicine

## 2024-04-01 NOTE — Telephone Encounter (Signed)
 Spoke with patient and tried to schedule her a Transfer of Care appt with Moira Andrews but first available is not until 9/11. Pt and mom says they can't wait that long. Wanted to know if any other female providers that had something sooner would accept pt? Sandra's first available is on 6/19. Is Adell Hones okay with this?

## 2024-04-01 NOTE — Telephone Encounter (Signed)
 Already scheduled. LS

## 2024-04-01 NOTE — Telephone Encounter (Unsigned)
 Copied from CRM [id number]. Topic: Appointments - Scheduling Inquiry for Clinic >> Apr 01, 2024 11:10 AM Rennis Case wrote: Reason for CRM: Pt requesting physical however patient requesting to transfer care to [facility] going forward. Please advise if okay to change PCP to [facility] and reach out to patient to schedule.   Best callback number: [phone]

## 2024-04-01 NOTE — Telephone Encounter (Signed)
 okay

## 2024-05-15 ENCOUNTER — Other Ambulatory Visit: Payer: Self-pay | Admitting: Family Medicine

## 2024-05-15 DIAGNOSIS — N946 Dysmenorrhea, unspecified: Secondary | ICD-10-CM

## 2024-05-22 ENCOUNTER — Encounter: Payer: Self-pay | Admitting: Nurse Practitioner

## 2024-05-22 ENCOUNTER — Ambulatory Visit (INDEPENDENT_AMBULATORY_CARE_PROVIDER_SITE_OTHER): Admitting: Nurse Practitioner

## 2024-05-22 VITALS — BP 100/60 | HR 60 | Temp 98.0°F | Ht 60.0 in | Wt 89.8 lb

## 2024-05-22 DIAGNOSIS — N921 Excessive and frequent menstruation with irregular cycle: Secondary | ICD-10-CM | POA: Insufficient documentation

## 2024-05-22 DIAGNOSIS — F419 Anxiety disorder, unspecified: Secondary | ICD-10-CM | POA: Diagnosis not present

## 2024-05-22 DIAGNOSIS — Z681 Body mass index (BMI) 19 or less, adult: Secondary | ICD-10-CM

## 2024-05-22 DIAGNOSIS — Z0001 Encounter for general adult medical examination with abnormal findings: Secondary | ICD-10-CM | POA: Diagnosis not present

## 2024-05-22 DIAGNOSIS — R636 Underweight: Secondary | ICD-10-CM | POA: Insufficient documentation

## 2024-05-22 DIAGNOSIS — F32A Depression, unspecified: Secondary | ICD-10-CM | POA: Insufficient documentation

## 2024-05-22 LAB — PREGNANCY, URINE: Preg Test, Ur: NEGATIVE

## 2024-05-22 LAB — LIPID PANEL

## 2024-05-22 MED ORDER — ENSURE MAX PROTEIN PO LIQD: 11.0000 [oz_av] | Freq: Every day | ORAL | 1 refills | Status: AC

## 2024-05-22 MED ORDER — MIRTAZAPINE 7.5 MG PO TABS: 7.5000 mg | ORAL_TABLET | Freq: Every day | ORAL | 1 refills | Status: DC

## 2024-05-22 NOTE — Progress Notes (Signed)
 Established Patient Office Visit  Subjective  Patient ID: Annebelle Bostic, female    DOB: March 23, 2001  Age: 23 y.o. MRN: [id number]  Chief Complaint  Patient presents with   Establish Care   Annual Exam   Joint Pain    Having joint pain all over since giving birth, unable to walk for long     HPI Keiry Kowal is a 23 year old female presenting on 05/22/2024 to establish care. She was previously under the care of Dr. Veleta Gerold and is re-establishing care with this provider. The patient reports persistent lower abdominal and lower back pain since giving birth via vaginal delivery in April 2024.  She describes severe cramping during her menstrual cycle, accompanied by heavy menstrual bleeding--changing fully saturated tampons every 2 hours and occasionally bleeding through her clothes. She reports passing large clots and notes minimal relief with her current contraceptive method (unspecified).  She also reports chronic lower back pain, which worsens during her menstrual cycle and is aggravated by prolonged standing. She works at [employer] and is on her feet for most of her shifts.  Patient is underweight with a BMI of 17.54 but denies any history of eating disorders. LMP 05/16/2024     05/22/2024    2:52 PM 12/28/2022   11:33 AM 03/24/2022   12:37 PM  GAD 7 : Generalized Anxiety Score  Nervous, Anxious, on Edge 1 1 3   Control/stop worrying 1 0 2  Worry too much - different things 1 0 0  Trouble relaxing 1 0 0  Restless 1 0 0  Easily annoyed or irritable 1 0 1  Afraid - awful might happen 1 0 1  Total GAD 7 Score 7 1 7   Anxiety Difficulty Somewhat difficult  Somewhat difficult        05/22/2024    2:52 PM 12/28/2022   11:33 AM 03/24/2022   12:36 PM  PHQ9 SCORE ONLY  PHQ-9 Total Score 10 0 3     Patient Active Problem List   Diagnosis Date Noted   Menorrhagia with irregular cycle 05/22/2024   Encounter for general adult medical examination with abnormal findings 05/22/2024    Underweight (BMI < 18.5) 05/22/2024   Anxiety and depression 05/22/2024   Vaginal delivery 03/17/2023   Non-reassuring electronic fetal monitoring tracing 03/16/2023   Supervision of normal first pregnancy 12/28/2022   Dysmenorrhea 08/15/2018   Gastroesophageal reflux disease with esophagitis 10/04/2016   Past Medical History:  Diagnosis Date   Medical history non-contributory    Past Surgical History:  Procedure Laterality Date   NO PAST SURGERIES     Social History   Tobacco Use   Smoking status: Never    Passive exposure: Yes   Smokeless tobacco: Never  Vaping Use   Vaping status: Never Used  Substance Use Topics   Alcohol use: No    Alcohol/week: 0.0 standard drinks of alcohol   Drug use: No   Social History   Socioeconomic History   Marital status: Single    Spouse name: Not on file   Number of children: Not on file   Years of education: Not on file   Highest education level: Not on file  Occupational History   Not on file  Tobacco Use   Smoking status: Never    Passive exposure: Yes   Smokeless tobacco: Never  Vaping Use   Vaping status: Never Used  Substance and Sexual Activity   Alcohol use: No    Alcohol/week: 0.0 standard drinks of alcohol  Drug use: No   Sexual activity: Yes    Birth control/protection: None  Other Topics Concern   Not on file  Social History Narrative   Not on file   Social Drivers of Health   Financial Resource Strain: Low Risk  (12/28/2022)   Overall Financial Resource Strain (CARDIA)    Difficulty of Paying Living Expenses: Not hard at all  Food Insecurity: No Food Insecurity (12/28/2022)   Hunger Vital Sign    Worried About Running Out of Food in the Last Year: Never true    Ran Out of Food in the Last Year: Never true  Transportation Needs: No Transportation Needs (12/28/2022)   PRAPARE - Administrator, Civil Service (Medical): No    Lack of Transportation (Non-Medical): No  Physical Activity: Inactive  (12/28/2022)   Exercise Vital Sign    Days of Exercise per Week: 0 days    Minutes of Exercise per Session: 0 min  Stress: No Stress Concern Present (12/28/2022)   [employer] of Occupational Health - Occupational Stress Questionnaire    Feeling of Stress : Not at all  Social Connections: Moderately Isolated (12/28/2022)   Social Connection and Isolation Panel    Frequency of Communication with Friends and Family: More than three times a week    Frequency of Social Gatherings with Friends and Family: Never    Attends Religious Services: Never    Database administrator or Organizations: No    Attends Banker Meetings: Never    Marital Status: Living with partner  Intimate Partner Violence: Not At Risk (12/28/2022)   Humiliation, Afraid, Rape, and Kick questionnaire    Fear of Current or Ex-Partner: No    Emotionally Abused: No    Physically Abused: No    Sexually Abused: No   Family Status  Relation Name Status   Mother  Alive   Father  Alive   Sister x5 Alive   Brother  Alive   MGM  Deceased   MGF  Alive   PGM  Alive   PGF  Deceased  No partnership data on file   Family History  Problem Relation Age of Onset   Breast cancer Maternal Grandmother    No Known Allergies    Review of Systems  Constitutional:  Negative for chills and fever.  HENT:  Negative for congestion and sore throat.   Respiratory:  Negative for cough.   Gastrointestinal:  Positive for abdominal pain. Negative for nausea and vomiting.       Mid epigastric  Genitourinary:        Abnormal cycle with heavy bleeding  Musculoskeletal:  Negative for falls.  Skin:  Negative for itching and rash.  Neurological:  Negative for dizziness and headaches.  Endo/Heme/Allergies:  Does not bruise/bleed easily.  Psychiatric/Behavioral:  Positive for depression. Negative for suicidal ideas. The patient is nervous/anxious and has insomnia.    Negative unless indicated in HPI   Objective:     BP  100/60   Pulse 60   Temp 98 F (36.7 C) (Temporal)   Ht 5' (1.524 m)   Wt 89 lb 12.8 oz (40.7 kg)   SpO2 98%   BMI 17.54 kg/m  BP Readings from Last 3 Encounters:  05/22/24 100/60  05/28/23 (!) 76/45  03/18/23 110/75   Wt Readings from Last 3 Encounters:  05/22/24 89 lb 12.8 oz (40.7 kg)  05/28/23 107 lb 12.8 oz (48.9 kg) (12%, Z= -1.18)*  03/17/23 124 lb  8 oz (56.5 kg) (44%, Z= -0.15)*   * Growth percentiles are based on CDC (Girls, 2-20 Years) data.      Physical Exam Vitals and nursing note reviewed.  Constitutional:      General: She is not in acute distress.    Appearance: She is underweight.  HENT:     Head: Normocephalic and atraumatic.     Right Ear: Tympanic membrane, ear canal and external ear normal. There is no impacted cerumen.     Left Ear: Tympanic membrane, ear canal and external ear normal. There is no impacted cerumen.     Nose: Nose normal.     Mouth/Throat:     Mouth: Mucous membranes are moist.   Eyes:     General: No scleral icterus.    Extraocular Movements: Extraocular movements intact.     Conjunctiva/sclera: Conjunctivae normal.     Pupils: Pupils are equal, round, and reactive to light.    Cardiovascular:     Heart sounds: Normal heart sounds.  Pulmonary:     Effort: Pulmonary effort is normal.     Breath sounds: Normal breath sounds.  Abdominal:     General: Abdomen is flat.     Palpations: Abdomen is soft.     Tenderness: There is abdominal tenderness in the epigastric area. There is no guarding or rebound. Negative signs include Murphy's sign, Rovsing's sign and McBurney's sign.   Musculoskeletal:        General: Normal range of motion.     Right lower leg: No edema.     Left lower leg: No edema.   Skin:    General: Skin is warm and dry.     Findings: No rash.   Neurological:     Mental Status: She is alert and oriented to person, place, and time.   Psychiatric:        Attention and Perception: Attention and perception  normal.        Mood and Affect: Mood is anxious.        Speech: Speech normal.        Behavior: Behavior normal. Behavior is cooperative.        Thought Content: Thought content normal. Thought content does not include homicidal or suicidal ideation. Thought content does not include homicidal or suicidal plan.        Cognition and Memory: Cognition and memory normal.        Judgment: Judgment normal.    No results found for any visits on 05/22/24.  Last CBC Lab Results  Component Value Date   WBC 4.2 05/28/2023   HGB 10.7 (L) 05/28/2023   HCT 34.3 05/28/2023   MCV 82 05/28/2023   MCH 25.6 (L) 05/28/2023   RDW 19.5 (H) 05/28/2023   PLT 288 05/28/2023   Last metabolic panel Lab Results  Component Value Date   GLUCOSE 86 05/28/2023   NA 140 05/28/2023   K 4.3 05/28/2023   CL 107 (H) 05/28/2023   CO2 21 05/28/2023   BUN 11 05/28/2023   CREATININE 0.87 05/28/2023   EGFR 98 05/28/2023   CALCIUM 9.2 05/28/2023   PROT 6.7 05/28/2023   ALBUMIN 4.3 05/28/2023   LABGLOB 2.4 05/28/2023   AGRATIO 1.8 09/19/2017   BILITOT <0.2 05/28/2023   ALKPHOS 75 05/28/2023   AST 16 05/28/2023   ALT 13 05/28/2023   Last thyroid  functions Lab Results  Component Value Date   TSH 1.340 05/28/2023        Assessment &  Plan:  Menorrhagia with irregular cycle -     Ambulatory referral to Obstetrics / Gynecology  Underweight (BMI < 18.5) -     CBC with Differential/Platelet -     Thyroid  Panel With TSH -     Mirtazapine; Take 1 tablet (7.5 mg total) by mouth at bedtime.  Dispense: 30 tablet; Refill: 1 -     Ensure Max Protein; Take 330 mLs (11 oz total) by mouth daily.  Dispense: 330 mL; Refill: 1  Encounter for general adult medical examination with abnormal findings -     CBC with Differential/Platelet -     Comprehensive metabolic panel with GFR -     Lipid panel -     Thyroid  Panel With TSH -     HepB+HepC+HIV Panel -     Ct Ng M genitalium NAA, Urine  Anxiety and depression -      CBC with Differential/Platelet -     Mirtazapine; Take 1 tablet (7.5 mg total) by mouth at bedtime.  Dispense: 30 tablet; Refill: 1 -     Pregnancy, urine -     VITAMIN D 25 Hydroxy (Vit-D Deficiency, Fractures)   Maddox is a 23 yrs old seen to establish care, no acute distress  Menorrhagia: Referral to GYN for further care  Underweight: Will start Remeron 7.5 mg and Ensure 1 can twice daily hide other significant changes in T4 in show she can get it over-the-counter Anxiety/depression: Start Remeron 7.5 mg  Labs: CBC, CMP, lipid, TSH, hep B, hep C HIV, gonorrhea and chlamydia and hCG result pending Return in about 6 weeks (around 07/03/2024) for  medication dose adjustment.    Claretta Kendra [place] Thompson, DNP [facility] [address] [place], [place] [address] [phone]    Note: This document was prepared by [employer] voice dictation technology and any errors that results from this process are unintentional.

## 2024-05-23 LAB — HEPB+HEPC+HIV PANEL
HIV Screen 4th Generation wRfx: NONREACTIVE
Hep B C IgM: NEGATIVE
Hep B Core Total Ab: NEGATIVE
Hep B E Ab: NONREACTIVE
Hep B E Ag: NEGATIVE
Hep B Surface Ab, Qual: NONREACTIVE
Hep C Virus Ab: NONREACTIVE
Hepatitis B Surface Ag: NEGATIVE

## 2024-05-23 LAB — CBC WITH DIFFERENTIAL/PLATELET
Basophils Absolute: 0.1 10*3/uL (ref 0.0–0.2)
Basos: 2 %
EOS (ABSOLUTE): 0.1 10*3/uL (ref 0.0–0.4)
Eos: 3 %
Hematocrit: 37.8 % (ref 34.0–46.6)
Hemoglobin: 12.2 g/dL (ref 11.1–15.9)
Immature Grans (Abs): 0 10*3/uL (ref 0.0–0.1)
Immature Granulocytes: 0 %
Lymphocytes Absolute: 2.2 10*3/uL (ref 0.7–3.1)
Lymphs: 42 %
MCH: 29.8 pg (ref 26.6–33.0)
MCHC: 32.3 g/dL (ref 31.5–35.7)
MCV: 92 fL (ref 79–97)
Monocytes Absolute: 0.3 10*3/uL (ref 0.1–0.9)
Monocytes: 7 %
Neutrophils Absolute: 2.5 10*3/uL (ref 1.4–7.0)
Neutrophils: 46 %
Platelets: 382 10*3/uL (ref 150–450)
RBC: 4.09 x10E6/uL (ref 3.77–5.28)
RDW: 13.8 % (ref 11.7–15.4)
WBC: 5.3 10*3/uL (ref 3.4–10.8)

## 2024-05-23 LAB — COMPREHENSIVE METABOLIC PANEL WITH GFR
ALT: 9 IU/L (ref 0–32)
AST: 16 IU/L (ref 0–40)
Albumin: 4.3 g/dL (ref 4.0–5.0)
Alkaline Phosphatase: 60 IU/L (ref 42–106)
BUN/Creatinine Ratio: 12 (ref 9–23)
BUN: 8 mg/dL (ref 6–20)
Bilirubin Total: <0.2 mg/dL (ref 0.0–1.2)
CO2: 23 mmol/L (ref 20–29)
Calcium: 9.1 mg/dL (ref 8.7–10.2)
Chloride: 104 mmol/L (ref 96–106)
Creatinine, Ser: 0.68 mg/dL (ref 0.57–1.00)
Globulin, Total: 2.6 g/dL (ref 1.5–4.5)
Glucose: 86 mg/dL (ref 70–99)
Potassium: 4.1 mmol/L (ref 3.5–5.2)
Sodium: 140 mmol/L (ref 134–144)
Total Protein: 6.9 g/dL (ref 6.0–8.5)
eGFR: 128 mL/min/{1.73_m2} (ref >59)

## 2024-05-23 LAB — THYROID PANEL WITH TSH
Free Thyroxine Index: 2.5 (ref 1.2–4.9)
T3 Uptake Ratio: 25 % (ref 24–39)
T4, Total: 10 ug/dL (ref 4.5–12.0)
TSH: 1 u[IU]/mL (ref 0.450–4.500)

## 2024-05-23 LAB — LIPID PANEL
Cholesterol, Total: 148 mg/dL (ref 100–199)
HDL: 65 mg/dL (ref >39)
LDL CALC COMMENT:: 2.3 ratio (ref 0.0–4.4)
LDL Chol Calc (NIH): 68 mg/dL (ref 0–99)
Triglycerides: 76 mg/dL (ref 0–149)
VLDL Cholesterol Cal: 15 mg/dL (ref 5–40)

## 2024-05-23 LAB — VITAMIN D 25 HYDROXY (VIT D DEFICIENCY, FRACTURES): Vit D, 25-Hydroxy: 29.2 ng/mL — ABNORMAL LOW (ref 30.0–100.0)

## 2024-05-25 ENCOUNTER — Ambulatory Visit: Payer: Self-pay | Admitting: Nurse Practitioner

## 2024-05-25 DIAGNOSIS — E559 Vitamin D deficiency, unspecified: Secondary | ICD-10-CM | POA: Insufficient documentation

## 2024-05-25 MED ORDER — VITAMIN D (ERGOCALCIFEROL) 1.25 MG (50000 UNIT) PO CAPS: 50000.0000 [IU] | ORAL_CAPSULE | ORAL | 0 refills | Status: DC

## 2024-05-26 ENCOUNTER — Ambulatory Visit: Payer: Medicaid Other | Admitting: Family Medicine

## 2024-05-27 LAB — CT NG M GENITALIUM NAA, URINE: Mycoplasma genitalium NAA: NEGATIVE

## 2024-06-14 ENCOUNTER — Other Ambulatory Visit: Payer: Self-pay | Admitting: Nurse Practitioner

## 2024-06-14 DIAGNOSIS — R636 Underweight: Secondary | ICD-10-CM

## 2024-06-14 DIAGNOSIS — F419 Anxiety disorder, unspecified: Secondary | ICD-10-CM

## 2024-07-03 ENCOUNTER — Encounter: Payer: Self-pay | Admitting: Nurse Practitioner

## 2024-07-03 ENCOUNTER — Ambulatory Visit (INDEPENDENT_AMBULATORY_CARE_PROVIDER_SITE_OTHER): Admitting: Nurse Practitioner

## 2024-07-03 VITALS — BP 102/60 | HR 60 | Temp 98.8°F | Ht 60.0 in | Wt 93.0 lb

## 2024-07-03 DIAGNOSIS — E559 Vitamin D deficiency, unspecified: Secondary | ICD-10-CM

## 2024-07-03 DIAGNOSIS — F32A Depression, unspecified: Secondary | ICD-10-CM

## 2024-07-03 DIAGNOSIS — F419 Anxiety disorder, unspecified: Secondary | ICD-10-CM | POA: Diagnosis not present

## 2024-07-03 MED ORDER — MIRTAZAPINE 15 MG PO TBDP: 15.0000 mg | ORAL_TABLET | Freq: Every day | ORAL | 0 refills | Status: DC

## 2024-07-03 NOTE — Progress Notes (Signed)
 Established Patient Office Visit  Subjective  Patient ID: Jennifer Alexander, female    DOB: 06-09-2001  Age: 23 y.o. MRN: [id number]  No chief complaint on file.   HPI Jennifer Alexander is a 23 year old female who presents on 07/03/2024 for follow-up regarding chronic disease management. She reports recent weight gain, which she is pleased with. However, she endorses increased symptoms of anxiety. The patient is agreeable to an increase in her current dose of mirtazapine  to better manage her anxiety symptoms.  Appointment GYN 07/12/2024 LMP 06/11/2024   Patient Active Problem List   Diagnosis Date Noted   Vitamin D  deficiency 05/25/2024   Menorrhagia with irregular cycle 05/22/2024   Encounter for general adult medical examination with abnormal findings 05/22/2024   Underweight (BMI < 18.5) 05/22/2024   Anxiety and depression 05/22/2024   Vaginal delivery 03/17/2023   Non-reassuring electronic fetal monitoring tracing 03/16/2023   Supervision of normal first pregnancy 12/28/2022   Dysmenorrhea 08/15/2018   Gastroesophageal reflux disease with esophagitis 10/04/2016   Past Medical History:  Diagnosis Date   Medical history non-contributory    Past Surgical History:  Procedure Laterality Date   NO PAST SURGERIES     Social History   Tobacco Use   Smoking status: Never    Passive exposure: Yes   Smokeless tobacco: Never  Vaping Use   Vaping status: Never Used  Substance Use Topics   Alcohol use: No    Alcohol/week: 0.0 standard drinks of alcohol   Drug use: No   Social History   Socioeconomic History   Marital status: Single    Spouse name: Not on file   Number of children: Not on file   Years of education: Not on file   Highest education level: Not on file  Occupational History   Not on file  Tobacco Use   Smoking status: Never    Passive exposure: Yes   Smokeless tobacco: Never  Vaping Use   Vaping status: Never Used  Substance and Sexual Activity   Alcohol  use: No    Alcohol/week: 0.0 standard drinks of alcohol   Drug use: No   Sexual activity: Yes    Birth control/protection: None  Other Topics Concern   Not on file  Social History Narrative   Not on file   Social Drivers of Health   Financial Resource Strain: Low Risk  (12/28/2022)   Overall Financial Resource Strain (CARDIA)    Difficulty of Paying Living Expenses: Not hard at all  Food Insecurity: No Food Insecurity (07/03/2024)   Hunger Vital Sign    Worried About Running Out of Food in the Last Year: Never true    Ran Out of Food in the Last Year: Never true  Transportation Needs: No Transportation Needs (12/28/2022)   PRAPARE - Administrator, Civil Service (Medical): No    Lack of Transportation (Non-Medical): No  Physical Activity: Inactive (12/28/2022)   Exercise Vital Sign    Days of Exercise per Week: 0 days    Minutes of Exercise per Session: 0 min  Stress: No Stress Concern Present (12/28/2022)   [employer] of Occupational Health - Occupational Stress Questionnaire    Feeling of Stress : Not at all  Social Connections: Moderately Isolated (12/28/2022)   Social Connection and Isolation Panel    Frequency of Communication with Friends and Family: More than three times a week    Frequency of Social Gatherings with Friends and Family: Never    Attends Religious  Services: Never    Active Member of Clubs or Organizations: No    Attends Banker Meetings: Never    Marital Status: Living with partner  Intimate Partner Violence: Not At Risk (12/28/2022)   Humiliation, Afraid, Rape, and Kick questionnaire    Fear of Current or Ex-Partner: No    Emotionally Abused: No    Physically Abused: No    Sexually Abused: No   Family Status  Relation Name Status   Mother  Alive   Father  Alive   Sister x5 Alive   Brother  Alive   MGM  Deceased   MGF  Alive   PGM  Alive   PGF  Deceased  No partnership data on file   Family History  Problem  Relation Age of Onset   Breast cancer Maternal Grandmother    No Known Allergies    Review of Systems  Constitutional:  Negative for chills and fever.  HENT:  Negative for congestion and sore throat.   Respiratory:  Negative for cough and wheezing.   Cardiovascular:  Negative for chest pain and leg swelling.  Gastrointestinal:  Negative for blood in stool, nausea and vomiting.  Musculoskeletal:  Negative for falls.  Skin:  Negative for itching and rash.  Neurological:  Negative for dizziness.   Negative unless indicated in HPI   Objective:     BP 102/60   Pulse 60   Temp 98.8 F (37.1 C)   Ht 5' (1.524 m)   Wt 93 lb (42.2 kg)   LMP 06/11/2024 (Exact Date)   SpO2 97%   BMI 18.16 kg/m  BP Readings from Last 3 Encounters:  07/03/24 102/60  05/22/24 100/60  05/28/23 (!) 76/45   Wt Readings from Last 3 Encounters:  07/03/24 93 lb (42.2 kg)  05/22/24 89 lb 12.8 oz (40.7 kg)  05/28/23 107 lb 12.8 oz (48.9 kg) (12%, Z= -1.18)*   * Growth percentiles are based on CDC (Girls, 2-20 Years) data.      Physical Exam Vitals and nursing note reviewed.  Constitutional:      General: She is not in acute distress. HENT:     Head: Normocephalic and atraumatic.     Nose: Nose normal.     Mouth/Throat:     Mouth: Mucous membranes are moist.  Eyes:     General: No scleral icterus.    Extraocular Movements: Extraocular movements intact.     Conjunctiva/sclera: Conjunctivae normal.     Pupils: Pupils are equal, round, and reactive to light.  Cardiovascular:     Heart sounds: Normal heart sounds.  Pulmonary:     Effort: Pulmonary effort is normal.     Breath sounds: Normal breath sounds.  Musculoskeletal:        General: Normal range of motion.     Right lower leg: No edema.     Left lower leg: No edema.  Skin:    General: Skin is warm and dry.     Findings: No rash.  Neurological:     Mental Status: She is alert and oriented to person, place, and time.  Psychiatric:         Mood and Affect: Mood normal.        Behavior: Behavior normal.        Thought Content: Thought content normal.        Judgment: Judgment normal.      No results found for any visits on 07/03/24.  Last CBC Lab Results  Component Value Date   WBC 5.3 05/22/2024   HGB 12.2 05/22/2024   HCT 37.8 05/22/2024   MCV 92 05/22/2024   MCH 29.8 05/22/2024   RDW 13.8 05/22/2024   PLT 382 05/22/2024   Last metabolic panel Lab Results  Component Value Date   GLUCOSE 86 05/22/2024   NA 140 05/22/2024   K 4.1 05/22/2024   CL 104 05/22/2024   CO2 23 05/22/2024   BUN 8 05/22/2024   CREATININE 0.68 05/22/2024   EGFR 128 05/22/2024   CALCIUM 9.1 05/22/2024   PROT 6.9 05/22/2024   ALBUMIN 4.3 05/22/2024   LABGLOB 2.6 05/22/2024   AGRATIO 1.8 09/19/2017   BILITOT <0.2 05/22/2024   ALKPHOS 60 05/22/2024   AST 16 05/22/2024   ALT 9 05/22/2024   Last lipids Lab Results  Component Value Date   CHOL 148 05/22/2024   HDL 65 05/22/2024   LDLCALC 68 05/22/2024   TRIG 76 05/22/2024   CHOLHDL 2.3 05/22/2024   Last hemoglobin A1c No results found for: HGBA1C Last thyroid  functions Lab Results  Component Value Date   TSH 1.000 05/22/2024   T4TOTAL 10.0 05/22/2024        Assessment & Plan:  Anxiety and depression  Vitamin D  deficiency  Other orders -     Mirtazapine ; Take 1 tablet (15 mg total) by mouth at bedtime.  Dispense: 90 tablet; Refill: 0   Kersti is a 73 yrs female seen today for chronic diseases management Anxiety and depression: increase Remeron  15 mg at bedtime, continue ensure and protein The above assessment and management plan was discussed with the patient. The patient verbalized understanding of and has agreed to the management plan. Patient is aware to call the clinic if they develop any new symptoms or if symptoms persist or worsen. Patient is aware when to return to the clinic for a follow-up visit. Patient educated on when it is appropriate to go to  the emergency department.  Return in about 3 months (around 10/03/2024) for chronic Diseases Management.    Lean Fayson [place] Thompson, DNP [facility] [address] [place], [place] [address] [phone]    Note: This document was prepared by [employer] voice dictation technology and any errors that results from this process are unintentional.

## 2024-07-09 ENCOUNTER — Encounter: Payer: Self-pay | Admitting: Adult Health

## 2024-07-09 ENCOUNTER — Ambulatory Visit: Admitting: Adult Health

## 2024-07-09 VITALS — BP 94/62 | HR 72 | Ht 60.0 in | Wt 92.0 lb

## 2024-07-09 DIAGNOSIS — N920 Excessive and frequent menstruation with regular cycle: Secondary | ICD-10-CM | POA: Diagnosis not present

## 2024-07-09 DIAGNOSIS — N946 Dysmenorrhea, unspecified: Secondary | ICD-10-CM | POA: Diagnosis not present

## 2024-07-09 DIAGNOSIS — Z3202 Encounter for pregnancy test, result negative: Secondary | ICD-10-CM | POA: Diagnosis not present

## 2024-07-09 LAB — POCT URINE PREGNANCY: Preg Test, Ur: NEGATIVE

## 2024-07-09 NOTE — Progress Notes (Signed)
  Subjective:     Patient ID: Jennifer Alexander, female   DOB: 2001/06/01, 23 y.o.   MRN: [id number]  HPI Jennifer Alexander is a 23 year old white female, with SO, G1P1001 in complaining of heavy painful periods, since having son last April, she is on BCP. Periods last about 5 days with 4-5 days heavy, and will change tampons every 2 hours and she passed clots in May and next period was lighter. She feels crampy and ache at times when not on period.  PCP is Dr Zollie Review of Systems  +heavy painful periods, since having son last April, she is on BCP. Periods last about 5 days with 4-5 days heavy, and will change tampons every 2 hours and she passed clots in May and next period was lighter. She feels crampy and ache at times when not on period. Denies any new partners, no problems with urination or BM Reviewed past medical,surgical, social and family history. Reviewed medications and allergies.     Objective:   Physical Exam BP 94/62 (BP Location: Left Arm, Patient Position: Sitting, Cuff Size: Small)   Pulse 72   Ht 5' (1.524 m)   Wt 92 lb (41.7 kg)   LMP 06/11/2024 (Exact Date)   Breastfeeding No   BMI 17.97 kg/m  UPT is negative. Had negative GC/CHL 05/22/24. Skin warm and dry.Pelvic: external genitalia is normal in appearance no lesions, vagina:scant white discharge without odor,urethra has no lesions or masses noted, cervix:smooth, uterus: normal size, shape and contour, non tender, no masses felt, adnexa: no masses, LLQ  tenderness noted. Bladder is non tender and no masses felt.      Upstream - 07/09/24 0925       Pregnancy Intention Screening   Does the patient want to become pregnant in the next year? No    Does the patient's partner want to become pregnant in the next year? No    Would the patient like to discuss contraceptive options today? No      Contraception Wrap Up   Current Method Oral Contraceptive    End Method Oral Contraceptive    Contraception Counseling Provided Yes          Examination chaperoned by Clarita Salt LPN  Assessment:     1. Negative pregnancy test - POCT urine pregnancy  2. Menorrhagia with regular cycle (Primary)  heavy painful periods, since having son last April, she is on BCP. Periods last about 5 days with 4-5 days heavy, and will change tampons every 2 hours and she passed clots in May and next period was lighter. She feels crampy and ache at times when not on period. Will get pelvic US  07/16/24 at 12:30 pm at [facility] to assess uterus and ovaries, if normal will change BCP to see if helps  - US  PELVIC COMPLETE WITH TRANSVAGINAL; Future  3. Dysmenorrhea +cramps and aches  - US  PELVIC COMPLETE WITH TRANSVAGINAL; Future     Plan:     Follow up in 2 weeks to review US  and options to treatement

## 2024-07-16 ENCOUNTER — Ambulatory Visit (HOSPITAL_COMMUNITY): Admission: RE | Admit: 2024-07-16 | Source: Ambulatory Visit

## 2024-07-23 ENCOUNTER — Ambulatory Visit: Admitting: Adult Health

## 2024-08-05 ENCOUNTER — Other Ambulatory Visit: Payer: Self-pay | Admitting: Family Medicine

## 2024-08-05 DIAGNOSIS — N946 Dysmenorrhea, unspecified: Secondary | ICD-10-CM

## 2024-08-15 ENCOUNTER — Other Ambulatory Visit: Payer: Self-pay | Admitting: Nurse Practitioner

## 2024-08-15 DIAGNOSIS — E559 Vitamin D deficiency, unspecified: Secondary | ICD-10-CM

## 2024-10-05 ENCOUNTER — Other Ambulatory Visit: Payer: Self-pay | Admitting: Nurse Practitioner

## 2024-10-07 ENCOUNTER — Encounter: Payer: Self-pay | Admitting: Family Medicine

## 2024-10-07 ENCOUNTER — Ambulatory Visit: Payer: Self-pay | Admitting: Family Medicine

## 2024-12-30 ENCOUNTER — Other Ambulatory Visit: Payer: Self-pay | Admitting: Family Medicine
